# Patient Record
Sex: Male | Born: 1965 | Race: White | Hispanic: No | Marital: Married | Smoking: Former smoker
Health system: Southern US, Community
[De-identification: ages and names within clinical notes are randomized; demographics above are authoritative.]

## PROBLEM LIST (undated history)

## (undated) DIAGNOSIS — K219 Gastro-esophageal reflux disease without esophagitis: Secondary | ICD-10-CM

## (undated) DIAGNOSIS — Z9889 Other specified postprocedural states: Secondary | ICD-10-CM

## (undated) DIAGNOSIS — M1990 Unspecified osteoarthritis, unspecified site: Secondary | ICD-10-CM

## (undated) DIAGNOSIS — R112 Nausea with vomiting, unspecified: Secondary | ICD-10-CM

## (undated) HISTORY — PX: REFRACTIVE SURGERY: SHX103

## (undated) HISTORY — PX: OTHER SURGICAL HISTORY: SHX169

## (undated) HISTORY — PX: JOINT REPLACEMENT: SHX530

## (undated) HISTORY — PX: CARPAL TUNNEL RELEASE: SHX101

## (undated) HISTORY — PX: VASECTOMY: SHX75

---

## 2003-11-20 ENCOUNTER — Emergency Department (HOSPITAL_COMMUNITY): Admission: EM | Admit: 2003-11-20 | Discharge: 2003-11-20 | Payer: Self-pay | Admitting: Emergency Medicine

## 2008-08-31 ENCOUNTER — Encounter: Admission: RE | Admit: 2008-08-31 | Discharge: 2008-08-31 | Payer: Self-pay | Admitting: Occupational Medicine

## 2010-08-26 ENCOUNTER — Encounter: Payer: Self-pay | Admitting: Family Medicine

## 2010-08-26 ENCOUNTER — Inpatient Hospital Stay (INDEPENDENT_AMBULATORY_CARE_PROVIDER_SITE_OTHER)
Admission: RE | Admit: 2010-08-26 | Discharge: 2010-08-26 | Disposition: A | Discharge status: Home / Self Care | Payer: 59 | Source: Ambulatory Visit | Attending: Family Medicine | Admitting: Family Medicine

## 2010-08-26 DIAGNOSIS — M940 Chondrocostal junction syndrome [Tietze]: Secondary | ICD-10-CM

## 2010-08-26 DIAGNOSIS — B084 Enteroviral vesicular stomatitis with exanthem: Secondary | ICD-10-CM

## 2010-08-28 ENCOUNTER — Telehealth (INDEPENDENT_AMBULATORY_CARE_PROVIDER_SITE_OTHER): Payer: Self-pay | Admitting: Emergency Medicine

## 2011-04-17 NOTE — Telephone Encounter (Signed)
  Phone Note Outgoing Call Call back at [facility] Phone [phone]   Call placed by: Emilio Math,  August 28, 2010 3:04 PM Call placed to: Patient Summary of Call: Left msg hope he is better give [place] a call if not getting better

## 2011-04-17 NOTE — Letter (Signed)
Summary: Out of Work  [facility]  [address]   [place], [place] [address]   Phone: [phone]  Fax: [phone]    August 26, 2010   Employee:  Sergio Harper    To Whom It May Concern:   [name] was evaluated in our clinic this morning and may safely return to work.   If you need additional information, please feel free to contact our office.         Sincerely,    Donna Christen MD

## 2011-04-17 NOTE — Progress Notes (Signed)
Summary: BLISTERS ON HANDS AND FEET Room 5   Vital Signs:  Patient Profile:   45 Years Old Male CC:      Painful red rash on feet and hands x 3 days Height:     74 inches Weight:      235 pounds O2 Sat:      99 % O2 treatment:    Room Air Temp:     98.7 degrees F oral Pulse rate:   76 / minute Pulse rhythm:   regular Resp:     12 per minute BP sitting:   108 / 73  (left arm) Cuff size:   regular  Vitals Entered By: Emilio Math (August 26, 2010 10:14 AM)                  Current Allergies: No known allergies History of Present Illness Chief Complaint: Painful red rash on feet and hands x 3 days History of Present Illness:  Subjective:  Patient complains of awakening four days ago with fatigue, myalgias, chills, and anterior chest discomfort.  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  He remained fatigued through the next day with low grade fever that evenually resolved.  Two days ago he developed an uncomfortable rash on the fingers of both hands, and a similar rash on both feet.  No lesions in mouth.  No sore throat or respiratory symptoms.  He now feels better except for rash on hands and feet.  Current Meds L-LYSINE 1000 MG TABS (LYSINE)  TYLENOL ARTHRITIS PAIN 650 MG CR-TABS (ACETAMINOPHEN)  VITAMIN D 400 UNIT CAPS (CHOLECALCIFEROL)  FISH OIL 1000 MG CAPS (OMEGA-3 FATTY ACIDS)  CEPHALEXIN 500 MG TABS (CEPHALEXIN) One by mouth three times daily (every 8 hours) (Rx void after 09/02/10).  REVIEW OF SYSTEMS Constitutional Symptoms      Denies fever, chills, night sweats, weight loss, weight gain, and fatigue.  Eyes       Denies change in vision, eye pain, eye discharge, glasses, contact lenses, and eye surgery. Ear/Nose/Throat/Mouth       Denies hearing loss/aids, change in hearing, ear pain, ear discharge, dizziness, frequent runny nose, frequent nose bleeds, sinus problems, sore throat, hoarseness, and tooth pain or bleeding.  Respiratory  Denies dry cough, productive cough, wheezing, shortness of breath, asthma, bronchitis, and emphysema/COPD.  Cardiovascular       Denies murmurs, chest pain, and tires easily with exhertion.    Gastrointestinal       Denies stomach pain, nausea/vomiting, diarrhea, constipation, blood in bowel movements, and indigestion. Genitourniary       Denies painful urination, kidney stones, and loss of urinary control. Neurological       Denies paralysis, seizures, and fainting/blackouts. Musculoskeletal       Denies muscle pain, joint pain, joint stiffness, decreased range of motion, redness, swelling, muscle weakness, and gout.  Skin       Denies bruising, unusual mles/lumps or sores, and hair/skin or nail changes.  Psych       Denies mood changes, temper/anger issues, anxiety/stress, speech problems, depression, and sleep problems.  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  Both feet  have similar subcutaneous vesicles on dorsa and plantar surfaces, mildly tender to palpation.  No swelling. Eyes:  Pupils are equal, round, and reactive to light and accomdation.  Extraocular movement is intact.  Conjunctivae are not inflamed.  Mouth/pharynx:  No lesions. Neck:  Supple.  No adenopathy is present.  Lungs:  Clear to auscultation.  Breath sounds are equal.  Chest:  Distinct tenderness over sternum Heart:  Regular rate and rhythm without murmurs, rubs, or gallops.  Abdomen:  Nontender without masses or hepatosplenomegaly.  Bowel sounds are present.  No CVA or flank tenderness.  Extremities:  No edema.   CBC:  WBC 9.1 with normal diff; Hgb  14.5 Assessment New Problems: COSTOCHONDRITIS, ACUTE (ICD-733.6) HAND, FOOT, AND MOUTH DISEASE (ICD-074.3)   Plan New Medications/Changes: CEPHALEXIN 500 MG TABS (CEPHALEXIN) One by mouth three times daily (every 8 hours) (Rx void after 09/02/10).  #30 x 0, 08/26/2010, Donna Christen MD  New Orders: New Patient Level III [id number] CBC w/Diff [[id number]] Planning Comments:   Reassurance.  Treat symptomatically for now with Tylenol. If lesions on feet become increasingly painful, swollen, draining, etc. after 3 to 4 days, add Keflex to cover a cellulitis (given an Rx to hold) Given a [facility] patient information and instruction sheet on topics  Follow-up with PCP if not improving one week.   The patient and/or caregiver has been counseled thoroughly with regard to medications prescribed including dosage, schedule, interactions, rationale for use, and possible side effects and they verbalize understanding.  Diagnoses and expected course of recovery discussed and will return if not improved as expected or if the condition worsens. Patient and/or caregiver verbalized understanding.  Prescriptions: CEPHALEXIN 500 MG TABS (CEPHALEXIN) One by mouth three times daily (every 8 hours) (Rx void after 09/02/10).  #30 x 0   Entered and Authorized by:   Donna Christen MD   Signed by:   Donna Christen MD on 08/26/2010   Method used:   Print then Give to Patient   RxID:   [id number]   Orders Added: 1)  New Patient Level III [78469] 2)  CBC w/Diff [[id number]]

## 2012-02-21 ENCOUNTER — Other Ambulatory Visit: Payer: Self-pay | Admitting: Family Medicine

## 2012-02-21 ENCOUNTER — Ambulatory Visit: Payer: Self-pay

## 2012-02-21 DIAGNOSIS — S8991XA Unspecified injury of right lower leg, initial encounter: Secondary | ICD-10-CM

## 2013-07-08 IMAGING — CR DG KNEE COMPLETE 4+V*R*
4 series · 4 of 4 positions shown · non-contrast
Comparison: 08/31/2008

CLINICAL DATA: Injury with pain

RIGHT KNEE - COMPLETE 4+ VIEW

[view not recorded (1 of 4)]
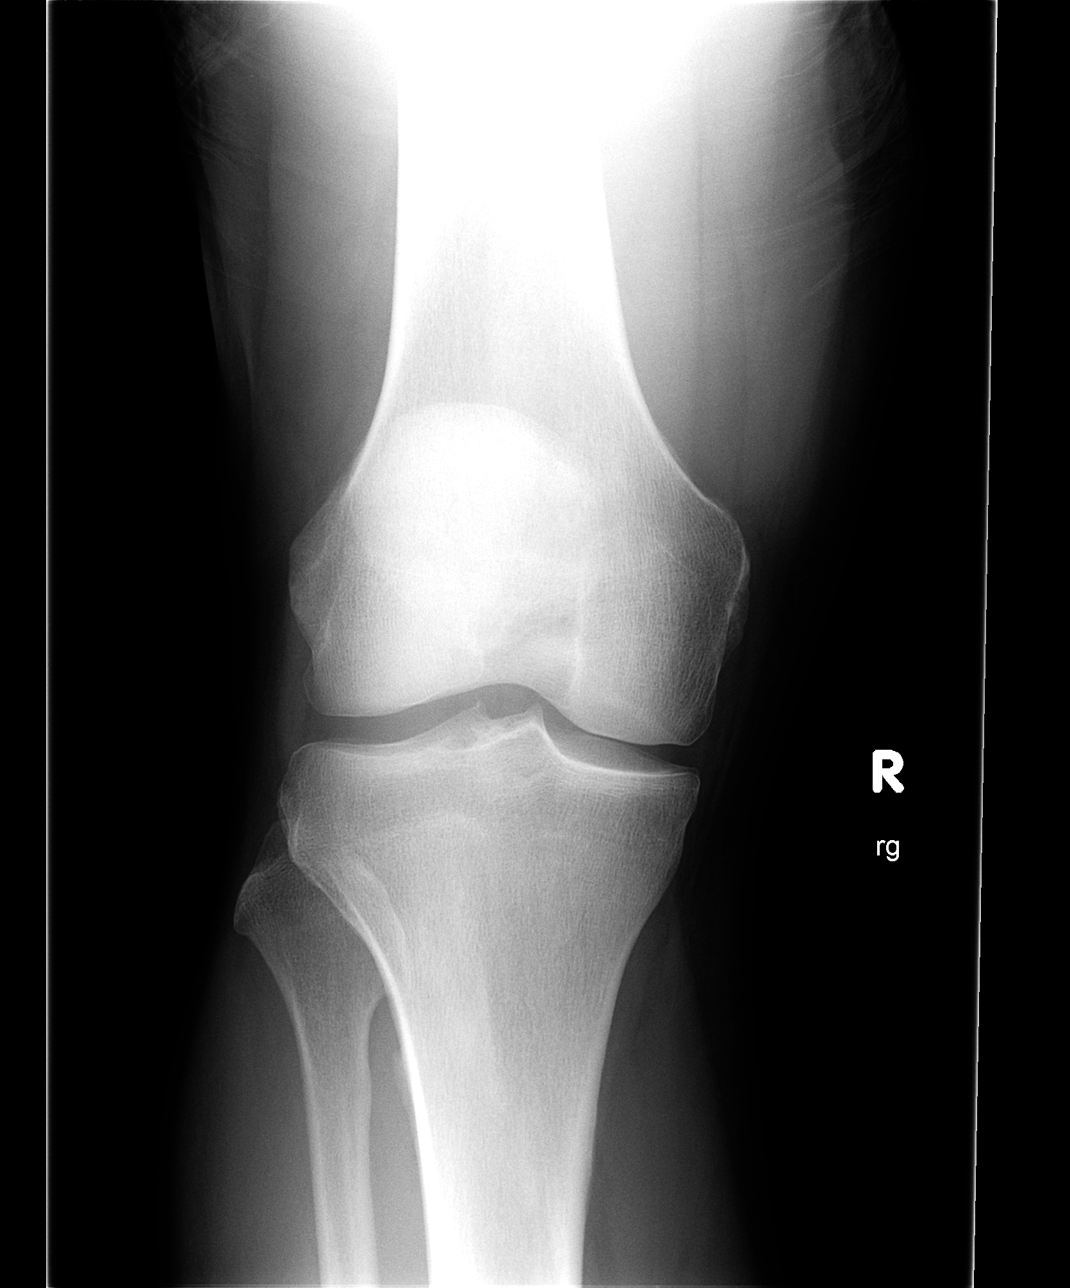

[view not recorded (2 of 4)]
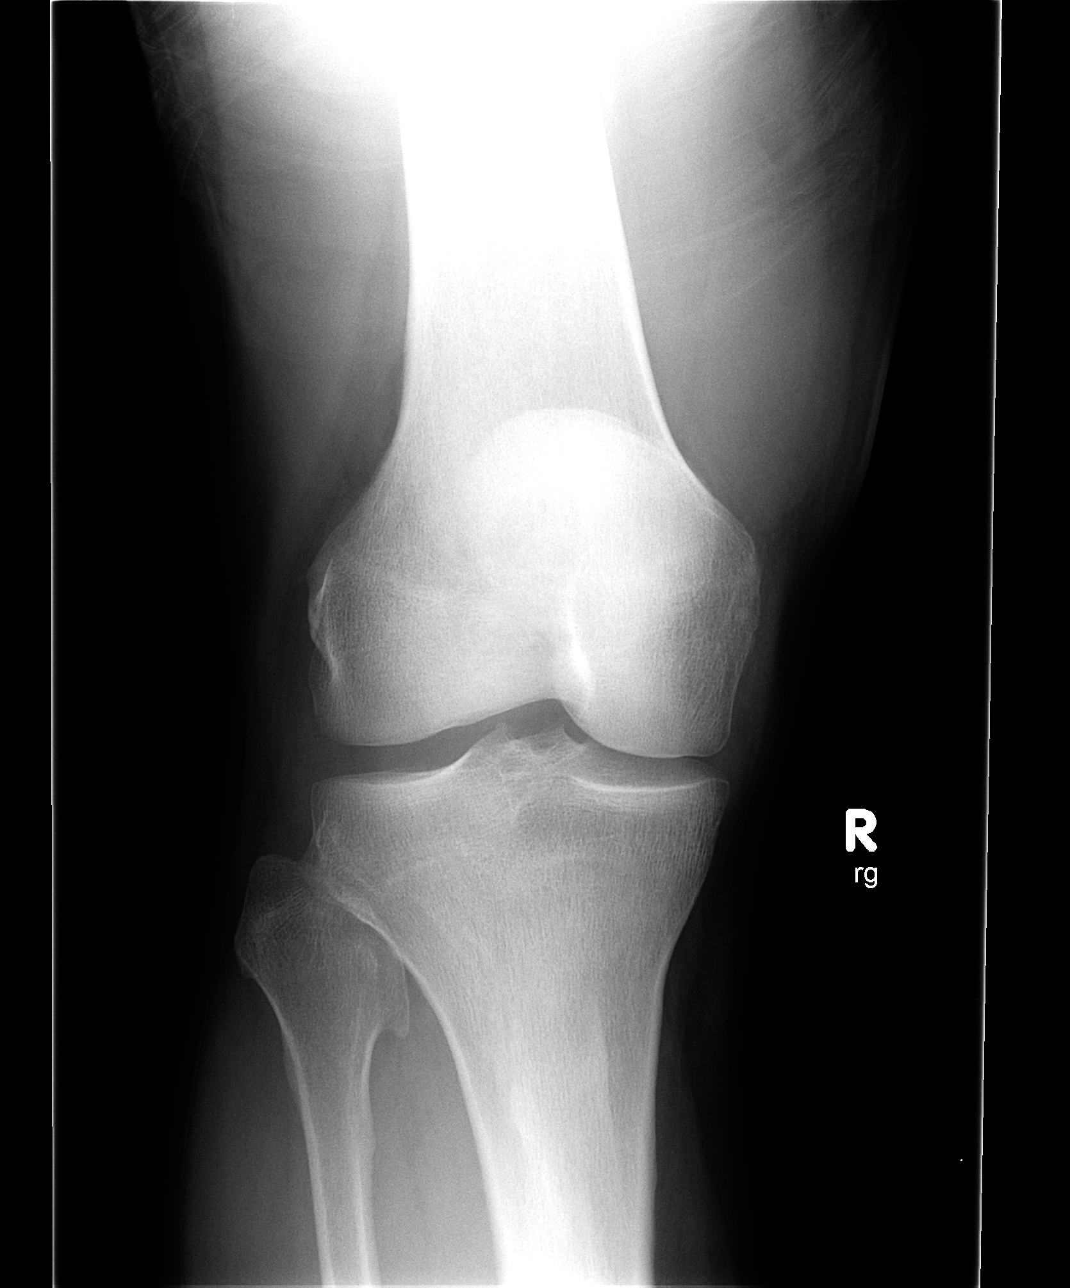

[view not recorded (3 of 4)]
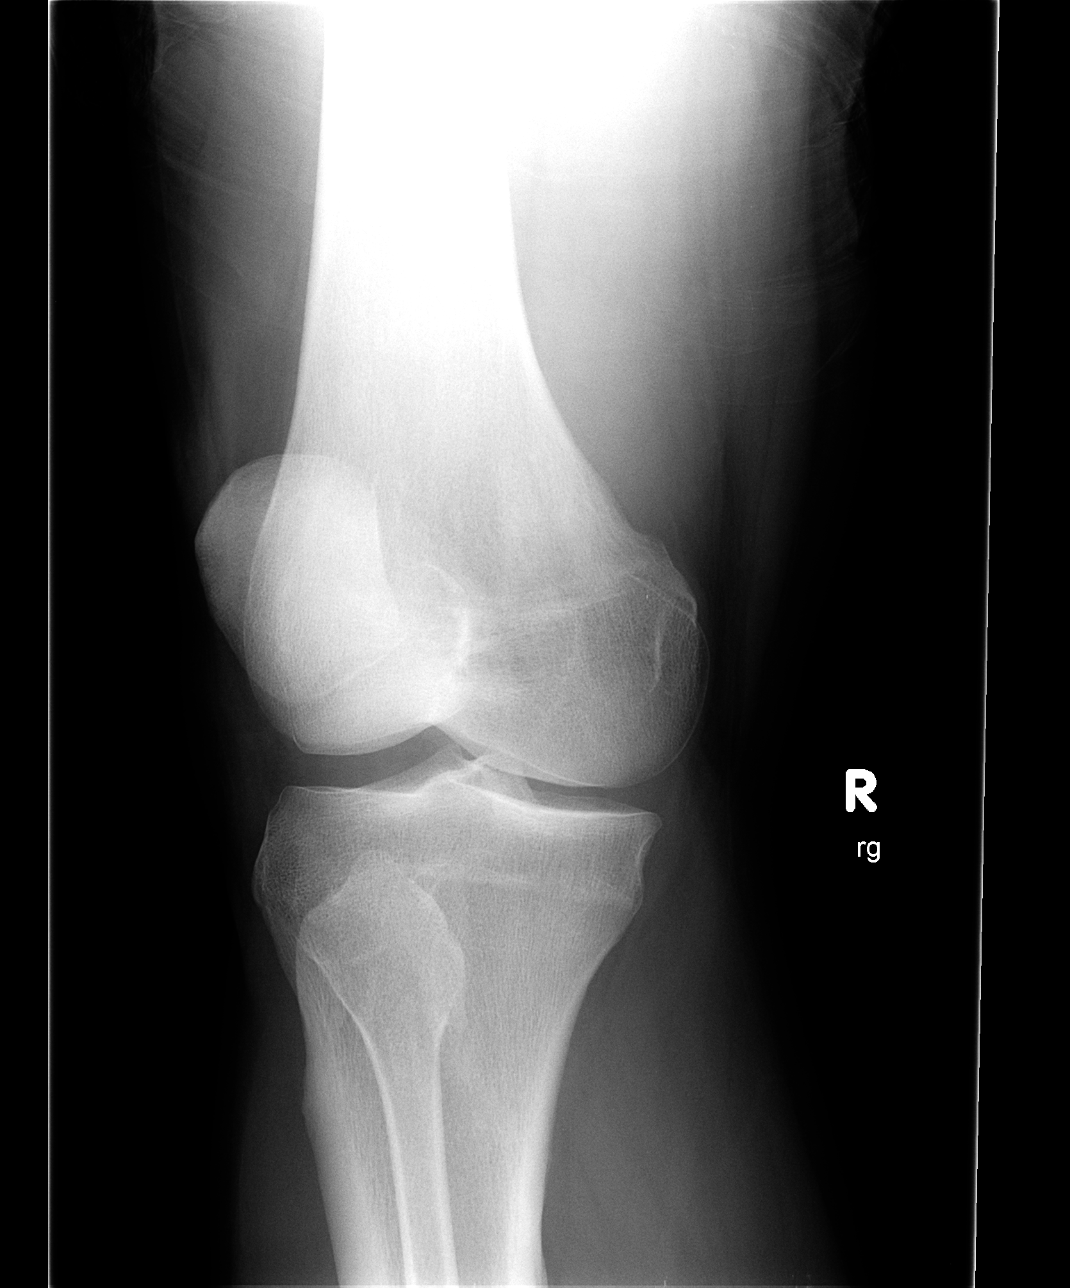

[view not recorded (4 of 4)]
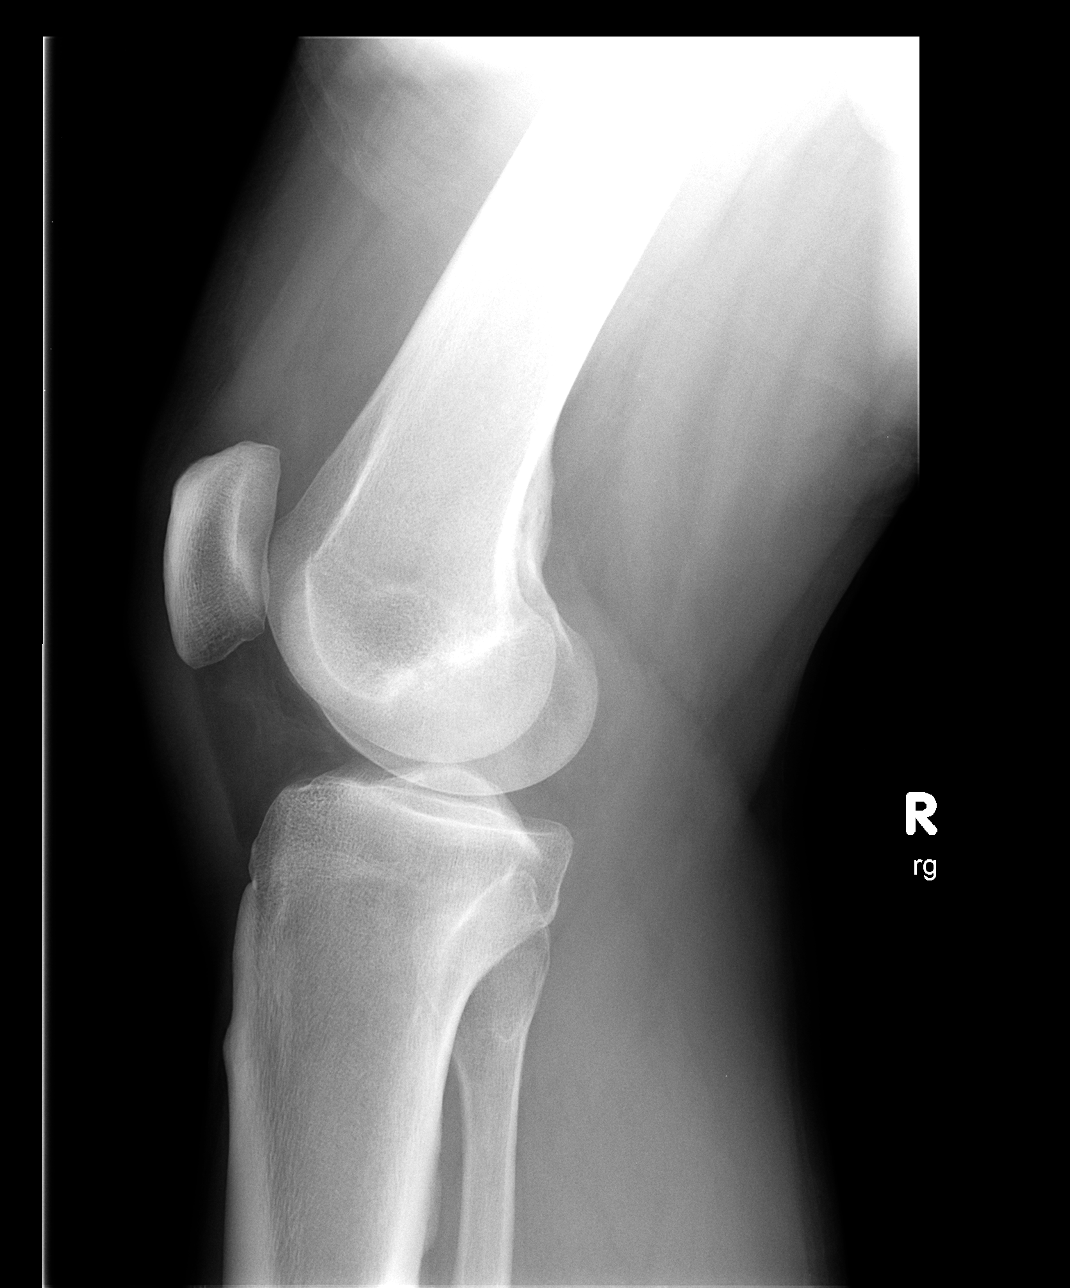

[4 of 4 positions shown; findings below may reference images not displayed]

FINDINGS: No evidence of fracture, dislocation, degenerative change
or joint effusion.
IMPRESSION: Negative radiographs

## 2016-12-11 ENCOUNTER — Ambulatory Visit: Payer: Self-pay | Admitting: Orthopedic Surgery

## 2016-12-22 NOTE — Patient Instructions (Signed)
Sergio Harper  12/22/2016   Your procedure is scheduled on: 01/03/2017    [facility]   Report to admitting at   0815 AM   Call this number if you have problems the morning of surgery  [phone]   Remember: ONLY 1 PERSON MAY GO WITH YOU TO SHORT STAY TO GET  READY MORNING OF YOUR SURGERY.  Do not eat food or drink liquids :After Midnight.     Take these medicines the morning of surgery with A SIP OF WATER:  DO NOT TAKE ANY DIABETIC MEDICATIONS DAY OF YOUR SURGERY                               You may not have any metal on your body including hair pins and              piercings  Do not wear jewelry, , lotions, powders or perfumes, deodorant                        Men may shave face and neck.   Do not bring valuables to the hospital. [facility] IS NOT             RESPONSIBLE   FOR VALUABLES.  Contacts, dentures or bridgework may not be worn into surgery.  Leave suitcase in the car. After surgery it may be brought to your room.                       Please read over the following fact sheets you were given: _____________________________________________________________________             [facility] - Preparing for Surgery Before surgery, you can play an important role.  Because skin is not sterile, your skin needs to be as free of germs as possible.  You can reduce the number of germs on your skin by washing with CHG (chlorahexidine gluconate) soap before surgery.  CHG is an antiseptic cleaner which kills germs and bonds with the skin to continue killing germs even after washing. Please DO NOT use if you have an allergy to CHG or antibacterial soaps.  If your skin becomes reddened/irritated stop using the CHG and inform your nurse when you arrive at Short Stay. Do not shave (including legs and underarms) for at least 48 hours prior to the first CHG shower.  You may shave your face/neck. Please follow these instructions  carefully:  1.  Shower with CHG Soap the night before surgery and the  morning of Surgery.  2.  If you choose to wash your hair, wash your hair first as usual with your  normal  shampoo.  3.  After you shampoo, rinse your hair and body thoroughly to remove the  shampoo.                           4.  Use CHG as you would any other liquid soap.  You can apply chg directly  to the skin and wash                       Gently with a scrungie or clean washcloth.  5.  Apply the CHG Soap to your body ONLY  FROM THE NECK DOWN.   Do not use on face/ open                           Wound or open sores. Avoid contact with eyes, ears mouth and genitals (private parts).                       Wash face,  Genitals (private parts) with your normal soap.             6.  Wash thoroughly, paying special attention to the area where your surgery  will be performed.  7.  Thoroughly rinse your body with warm water from the neck down.  8.  DO NOT shower/wash with your normal soap after using and rinsing off  the CHG Soap.                9.  Pat yourself dry with a clean towel.            10.  Wear clean pajamas.            11.  Place clean sheets on your bed the night of your first shower and do not  sleep with pets. Day of Surgery : Do not apply any lotions/deodorants the morning of surgery.  Please wear clean clothes to the hospital/surgery center.  FAILURE TO FOLLOW THESE INSTRUCTIONS MAY RESULT IN THE CANCELLATION OF YOUR SURGERY PATIENT SIGNATURE_________________________________  NURSE SIGNATURE__________________________________  ________________________________________________________________________  WHAT IS A BLOOD TRANSFUSION? Blood Transfusion Information  A transfusion is the replacement of blood or some of its parts. Blood is made up of multiple cells which provide different functions.  Red blood cells carry oxygen and are used for blood loss replacement.  White blood cells fight against  infection.  Platelets control bleeding.  Plasma helps clot blood.  Other blood products are available for specialized needs, such as hemophilia or other clotting disorders. BEFORE THE TRANSFUSION  Who gives blood for transfusions?   Healthy volunteers who are fully evaluated to make sure their blood is safe. This is blood bank blood. Transfusion therapy is the safest it has ever been in the practice of medicine. Before blood is taken from a donor, a complete history is taken to make sure that person has no history of diseases nor engages in risky social behavior (examples are intravenous drug use or sexual activity with multiple partners). The donor's travel history is screened to minimize risk of transmitting infections, such as malaria. The donated blood is tested for signs of infectious diseases, such as HIV and hepatitis. The blood is then tested to be sure it is compatible with you in order to minimize the chance of a transfusion reaction. If you or a relative donates blood, this is often done in anticipation of surgery and is not appropriate for emergency situations. It takes many days to process the donated blood. RISKS AND COMPLICATIONS Although transfusion therapy is very safe and saves many lives, the main dangers of transfusion include:   Getting an infectious disease.  Developing a transfusion reaction. This is an allergic reaction to something in the blood you were given. Every precaution is taken to prevent this. The decision to have a blood transfusion has been considered carefully by your caregiver before blood is given. Blood is not given unless the benefits outweigh the risks. AFTER THE TRANSFUSION  Right after receiving a blood transfusion, you will usually feel much better  and more energetic. This is especially true if your red blood cells have gotten low (anemic). The transfusion raises the level of the red blood cells which carry oxygen, and this usually causes an energy  increase.  The nurse administering the transfusion will monitor you carefully for complications. HOME CARE INSTRUCTIONS  No special instructions are needed after a transfusion. You may find your energy is better. Speak with your caregiver about any limitations on activity for underlying diseases you may have. SEEK MEDICAL CARE IF:   Your condition is not improving after your transfusion.  You develop redness or irritation at the intravenous (IV) site. SEEK IMMEDIATE MEDICAL CARE IF:  Any of the following symptoms occur over the next 12 hours:  Shaking chills.  You have a temperature by mouth above 102 F (38.9 C), not controlled by medicine.  Chest, back, or muscle pain.  People around you feel you are not acting correctly or are confused.  Shortness of breath or difficulty breathing.  Dizziness and fainting.  You get a rash or develop hives.  You have a decrease in urine output.  Your urine turns a dark color or changes to pink, red, or brown. Any of the following symptoms occur over the next 10 days:  You have a temperature by mouth above 102 F (38.9 C), not controlled by medicine.  Shortness of breath.  Weakness after normal activity.  The white part of the eye turns yellow (jaundice).  You have a decrease in the amount of urine or are urinating less often.  Your urine turns a dark color or changes to pink, red, or brown. Document Released: 04/28/2000 Document Revised: 07/24/2011 Document Reviewed: 12/16/2007 Exit[employer] Patient Information 2014 [place].  _______________________________________________________________________  Incentive Spirometer  An incentive spirometer is a tool that can help keep your lungs clear and active. This tool measures how well you are filling your lungs with each breath. Taking long deep breaths may help reverse or decrease the chance of developing breathing (pulmonary) problems (especially infection) following:  A long  period of time when you are unable to move or be active. BEFORE THE PROCEDURE   If the spirometer includes an indicator to show your best effort, your nurse or respiratory therapist will set it to a desired goal.  If possible, sit up straight or lean slightly forward. Try not to slouch.  Hold the incentive spirometer in an upright position. INSTRUCTIONS FOR USE  1. Sit on the edge of your bed if possible, or sit up as far as you can in bed or on a chair. 2. Hold the incentive spirometer in an upright position. 3. Breathe out normally. 4. Place the mouthpiece in your mouth and seal your lips tightly around it. 5. Breathe in slowly and as deeply as possible, raising the piston or the ball toward the top of the column. 6. Hold your breath for 3-5 seconds or for as long as possible. Allow the piston or ball to fall to the bottom of the column. 7. Remove the mouthpiece from your mouth and breathe out normally. 8. Rest for a few seconds and repeat Steps 1 through 7 at least 10 times every 1-2 hours when you are awake. Take your time and take a few normal breaths between deep breaths. 9. The spirometer may include an indicator to show your best effort. Use the indicator as a goal to work toward during each repetition. 10. After each set of 10 deep breaths, practice coughing to be sure your  lungs are clear. If you have an incision (the cut made at the time of surgery), support your incision when coughing by placing a pillow or rolled up towels firmly against it. Once you are able to get out of bed, walk around indoors and cough well. You may stop using the incentive spirometer when instructed by your caregiver.  RISKS AND COMPLICATIONS  Take your time so you do not get dizzy or light-headed.  If you are in pain, you may need to take or ask for pain medication before doing incentive spirometry. It is harder to take a deep breath if you are having pain. AFTER USE  Rest and breathe slowly and  easily.  It can be helpful to keep track of a log of your progress. Your caregiver can provide you with a simple table to help with this. If you are using the spirometer at home, follow these instructions: [place] IF:   You are having difficultly using the spirometer.  You have trouble using the spirometer as often as instructed.  Your pain medication is not giving enough relief while using the spirometer.  You develop fever of 100.5 F (38.1 C) or higher. SEEK IMMEDIATE MEDICAL CARE IF:   You cough up bloody sputum that had not been present before.  You develop fever of 102 F (38.9 C) or greater.  You develop worsening pain at or near the incision site. MAKE SURE YOU:   Understand these instructions.  Will watch your condition.  Will get help right away if you are not doing well or get worse. Document Released: 09/11/2006 Document Revised: 07/24/2011 Document Reviewed: 11/12/2006 [facility] Patient Information 2014 [place], [place].   ________________________________________________________________________

## 2016-12-26 ENCOUNTER — Encounter (HOSPITAL_COMMUNITY)
Admission: RE | Admit: 2016-12-26 | Discharge: 2016-12-26 | Disposition: A | Discharge status: Home / Self Care | Payer: BLUE CROSS/BLUE SHIELD | Source: Ambulatory Visit | Attending: Orthopedic Surgery | Admitting: Orthopedic Surgery

## 2016-12-26 ENCOUNTER — Encounter (HOSPITAL_COMMUNITY): Payer: Self-pay

## 2016-12-26 ENCOUNTER — Encounter (INDEPENDENT_AMBULATORY_CARE_PROVIDER_SITE_OTHER): Payer: Self-pay

## 2016-12-26 DIAGNOSIS — M1612 Unilateral primary osteoarthritis, left hip: Secondary | ICD-10-CM | POA: Insufficient documentation

## 2016-12-26 DIAGNOSIS — Z01812 Encounter for preprocedural laboratory examination: Secondary | ICD-10-CM | POA: Insufficient documentation

## 2016-12-26 HISTORY — DX: Gastro-esophageal reflux disease without esophagitis: K21.9

## 2016-12-26 HISTORY — DX: Unspecified osteoarthritis, unspecified site: M19.90

## 2016-12-26 HISTORY — DX: Other specified postprocedural states: Z98.890

## 2016-12-26 HISTORY — DX: Other specified postprocedural states: R11.2

## 2016-12-26 LAB — COMPREHENSIVE METABOLIC PANEL
ALBUMIN: 4.3 g/dL (ref 3.5–5.0)
ALT: 29 U/L (ref 17–63)
ANION GAP: 6 (ref 5–15)
AST: 30 U/L (ref 15–41)
Alkaline Phosphatase: 55 U/L (ref 38–126)
BILIRUBIN TOTAL: 0.8 mg/dL (ref 0.3–1.2)
BUN: 18 mg/dL (ref 6–20)
CHLORIDE: 106 mmol/L (ref 101–111)
CO2: 28 mmol/L (ref 22–32)
Calcium: 9.4 mg/dL (ref 8.9–10.3)
Creatinine, Ser: 1.14 mg/dL (ref 0.61–1.24)
GFR calc Af Amer: >60 mL/min (ref >60)
GFR calc non Af Amer: >60 mL/min (ref >60)
GLUCOSE: 83 mg/dL (ref 65–99)
POTASSIUM: 4.7 mmol/L (ref 3.5–5.1)
SODIUM: 140 mmol/L (ref 135–145)
Total Protein: 6.6 g/dL (ref 6.5–8.1)

## 2016-12-26 LAB — CBC
HEMATOCRIT: 42.2 % (ref 39.0–52.0)
Hemoglobin: 14.9 g/dL (ref 13.0–17.0)
MCH: 30.8 pg (ref 26.0–34.0)
MCHC: 35.3 g/dL (ref 30.0–36.0)
MCV: 87.4 fL (ref 78.0–100.0)
PLATELETS: 164 10*3/uL (ref 150–400)
RBC: 4.83 MIL/uL (ref 4.22–5.81)
RDW: 12.5 % (ref 11.5–15.5)
WBC: 6.3 10*3/uL (ref 4.0–10.5)

## 2016-12-26 LAB — PROTIME-INR
INR: 1.09
Prothrombin Time: 14.1 seconds (ref 11.4–15.2)

## 2016-12-26 LAB — APTT: APTT: 31 s (ref 24–36)

## 2016-12-26 LAB — SURGICAL PCR SCREEN
MRSA, PCR: NEGATIVE
STAPHYLOCOCCUS AUREUS: POSITIVE — AB

## 2016-12-27 LAB — ABO/RH: ABO/RH(D): B POS

## 2017-01-02 ENCOUNTER — Ambulatory Visit: Payer: Self-pay | Admitting: Orthopedic Surgery
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
## 2017-01-03 ENCOUNTER — Inpatient Hospital Stay (HOSPITAL_COMMUNITY)
Admission: RE | Admit: 2017-01-03 | Discharge: 2017-01-04 | DRG: 470 | Disposition: A | Discharge status: Home / Self Care | Payer: BLUE CROSS/BLUE SHIELD | Source: Ambulatory Visit | Attending: Orthopedic Surgery | Admitting: Orthopedic Surgery

## 2017-01-03 ENCOUNTER — Encounter (HOSPITAL_COMMUNITY): Payer: Self-pay | Admitting: *Deleted

## 2017-01-03 ENCOUNTER — Inpatient Hospital Stay (HOSPITAL_COMMUNITY): Payer: BLUE CROSS/BLUE SHIELD | Admitting: Certified Registered Nurse Anesthetist

## 2017-01-03 ENCOUNTER — Inpatient Hospital Stay (HOSPITAL_COMMUNITY): Payer: BLUE CROSS/BLUE SHIELD

## 2017-01-03 ENCOUNTER — Encounter (HOSPITAL_COMMUNITY)
Admission: RE | Disposition: A | Discharge status: Home / Self Care | Payer: Self-pay | Source: Ambulatory Visit | Attending: Orthopedic Surgery

## 2017-01-03 DIAGNOSIS — M169 Osteoarthritis of hip, unspecified: Secondary | ICD-10-CM | POA: Diagnosis present

## 2017-01-03 DIAGNOSIS — Z419 Encounter for procedure for purposes other than remedying health state, unspecified: Secondary | ICD-10-CM

## 2017-01-03 DIAGNOSIS — Z96641 Presence of right artificial hip joint: Secondary | ICD-10-CM | POA: Diagnosis present

## 2017-01-03 DIAGNOSIS — Z87891 Personal history of nicotine dependence: Secondary | ICD-10-CM

## 2017-01-03 DIAGNOSIS — M1612 Unilateral primary osteoarthritis, left hip: Secondary | ICD-10-CM | POA: Diagnosis present

## 2017-01-03 DIAGNOSIS — Z96649 Presence of unspecified artificial hip joint: Secondary | ICD-10-CM

## 2017-01-03 DIAGNOSIS — M25552 Pain in left hip: Secondary | ICD-10-CM | POA: Diagnosis present

## 2017-01-03 HISTORY — PX: TOTAL HIP ARTHROPLASTY: SHX124

## 2017-01-03 LAB — TYPE AND SCREEN
ABO/RH(D): B POS
ANTIBODY SCREEN: NEGATIVE

## 2017-01-03 SURGERY — ARTHROPLASTY, HIP, TOTAL, ANTERIOR APPROACH
Anesthesia: Spinal | Site: Hip | Laterality: Left

## 2017-01-03 MED ORDER — MORPHINE SULFATE (PF) 2 MG/ML IV SOLN
2.0000 mg | INTRAVENOUS | Status: AC | PRN
  Administered 2017-01-03 (×2): 2 mg via INTRAVENOUS
  Filled 2017-01-03 (×3): qty 1

## 2017-01-03 MED ORDER — METHOCARBAMOL 1000 MG/10ML IJ SOLN
500.0000 mg | Freq: Four times a day (QID) | INTRAVENOUS | Status: DC | PRN
  Administered 2017-01-03: 500 mg via INTRAVENOUS
  Filled 2017-01-03: qty 550

## 2017-01-03 MED ORDER — PROPOFOL 10 MG/ML IV BOLUS
INTRAVENOUS | Status: DC | PRN
  Administered 2017-01-03: 20 mg via INTRAVENOUS
  Administered 2017-01-03 (×4): 10 mg via INTRAVENOUS

## 2017-01-03 MED ORDER — DIPHENHYDRAMINE HCL 50 MG/ML IJ SOLN
INTRAMUSCULAR | Status: AC
  Filled 2017-01-03: qty 1

## 2017-01-03 MED ORDER — OXYCODONE HCL 5 MG PO TABS
5.0000 mg | ORAL_TABLET | ORAL | Status: DC | PRN
  Administered 2017-01-03 (×3): 10 mg via ORAL
  Filled 2017-01-03 (×3): qty 2

## 2017-01-03 MED ORDER — SODIUM CHLORIDE 0.9 % IV SOLN
INTRAVENOUS | Status: DC
  Administered 2017-01-03 (×2): via INTRAVENOUS

## 2017-01-03 MED ORDER — TRANEXAMIC ACID 1000 MG/10ML IV SOLN
1000.0000 mg | Freq: Once | INTRAVENOUS | Status: AC
  Administered 2017-01-03: 15:00:00 1000 mg via INTRAVENOUS
  Filled 2017-01-03: qty 1100

## 2017-01-03 MED ORDER — MORPHINE SULFATE (PF) 2 MG/ML IV SOLN
1.0000 mg | INTRAVENOUS | Status: DC | PRN
  Administered 2017-01-03: 15:00:00 1 mg via INTRAVENOUS
  Administered 2017-01-03: 20:00:00 2 mg via INTRAVENOUS
  Filled 2017-01-03: qty 1

## 2017-01-03 MED ORDER — POLYETHYLENE GLYCOL 3350 17 G PO PACK: 17.0000 g | PACK | Freq: Every day | ORAL | Status: DC | PRN

## 2017-01-03 MED ORDER — CEFAZOLIN SODIUM-DEXTROSE 2-4 GM/100ML-% IV SOLN
INTRAVENOUS | Status: AC
  Filled 2017-01-03: qty 100

## 2017-01-03 MED ORDER — ACETAMINOPHEN 500 MG PO TABS
1000.0000 mg | ORAL_TABLET | Freq: Four times a day (QID) | ORAL | Status: DC
  Administered 2017-01-03: 1000 mg via ORAL
  Filled 2017-01-03: qty 2

## 2017-01-03 MED ORDER — BUPIVACAINE HCL (PF) 0.25 % IJ SOLN
INTRAMUSCULAR | Status: DC | PRN
  Administered 2017-01-03: 30 mL

## 2017-01-03 MED ORDER — TRAMADOL HCL 50 MG PO TABS
50.0000 mg | ORAL_TABLET | Freq: Four times a day (QID) | ORAL | Status: DC | PRN
  Filled 2017-01-03: qty 2

## 2017-01-03 MED ORDER — CEFAZOLIN SODIUM-DEXTROSE 2-4 GM/100ML-% IV SOLN
2.0000 g | Freq: Four times a day (QID) | INTRAVENOUS | Status: AC
  Administered 2017-01-03 (×2): 2 g via INTRAVENOUS
  Filled 2017-01-03 (×2): qty 100

## 2017-01-03 MED ORDER — FLEET ENEMA 7-19 GM/118ML RE ENEM: 1.0000 | ENEMA | Freq: Once | RECTAL | Status: DC | PRN

## 2017-01-03 MED ORDER — FENTANYL CITRATE (PF) 100 MCG/2ML IJ SOLN
25.0000 ug | INTRAMUSCULAR | Status: DC | PRN
  Administered 2017-01-03: 50 ug via INTRAVENOUS

## 2017-01-03 MED ORDER — METOCLOPRAMIDE HCL 5 MG/ML IJ SOLN
5.0000 mg | Freq: Three times a day (TID) | INTRAMUSCULAR | Status: DC | PRN
  Administered 2017-01-04: 10 mg via INTRAVENOUS
  Filled 2017-01-03: qty 2

## 2017-01-03 MED ORDER — DIPHENHYDRAMINE HCL 50 MG/ML IJ SOLN
INTRAMUSCULAR | Status: DC | PRN
  Administered 2017-01-03: 25 mg via INTRAVENOUS

## 2017-01-03 MED ORDER — RIVAROXABAN 10 MG PO TABS
10.0000 mg | ORAL_TABLET | Freq: Every day | ORAL | Status: DC
  Administered 2017-01-04: 10 mg via ORAL
  Filled 2017-01-03: qty 1

## 2017-01-03 MED ORDER — DEXAMETHASONE SODIUM PHOSPHATE 10 MG/ML IJ SOLN
10.0000 mg | Freq: Once | INTRAMUSCULAR | Status: AC
  Administered 2017-01-04: 10 mg via INTRAVENOUS
  Filled 2017-01-03: qty 1

## 2017-01-03 MED ORDER — ONDANSETRON HCL 4 MG/2ML IJ SOLN
INTRAMUSCULAR | Status: DC | PRN
  Administered 2017-01-03: 4 mg via INTRAVENOUS

## 2017-01-03 MED ORDER — BUPIVACAINE HCL (PF) 0.5 % IJ SOLN
INTRAMUSCULAR | Status: DC | PRN
  Administered 2017-01-03: 3 mL

## 2017-01-03 MED ORDER — CHLORHEXIDINE GLUCONATE 4 % EX LIQD: 60.0000 mL | Freq: Once | CUTANEOUS | Status: DC

## 2017-01-03 MED ORDER — ACETAMINOPHEN 325 MG PO TABS: 650.0000 mg | ORAL_TABLET | Freq: Four times a day (QID) | ORAL | Status: DC | PRN

## 2017-01-03 MED ORDER — 0.9 % SODIUM CHLORIDE (POUR BTL) OPTIME
TOPICAL | Status: DC | PRN
  Administered 2017-01-03: 250 mL

## 2017-01-03 MED ORDER — BISACODYL 10 MG RE SUPP: 10.0000 mg | Freq: Every day | RECTAL | Status: DC | PRN

## 2017-01-03 MED ORDER — ONDANSETRON HCL 4 MG/2ML IJ SOLN
INTRAMUSCULAR | Status: AC
  Filled 2017-01-03: qty 2

## 2017-01-03 MED ORDER — PROPOFOL 10 MG/ML IV BOLUS
INTRAVENOUS | Status: AC
  Filled 2017-01-03: qty 20

## 2017-01-03 MED ORDER — BUPIVACAINE HCL (PF) 0.25 % IJ SOLN
INTRAMUSCULAR | Status: AC
  Filled 2017-01-03: qty 30

## 2017-01-03 MED ORDER — PROPOFOL 10 MG/ML IV BOLUS
INTRAVENOUS | Status: AC
  Filled 2017-01-03: qty 40

## 2017-01-03 MED ORDER — DIPHENHYDRAMINE HCL 12.5 MG/5ML PO ELIX: 12.5000 mg | ORAL_SOLUTION | ORAL | Status: DC | PRN

## 2017-01-03 MED ORDER — CEFAZOLIN SODIUM-DEXTROSE 2-4 GM/100ML-% IV SOLN
2.0000 g | INTRAVENOUS | Status: AC
  Administered 2017-01-03: 2 g via INTRAVENOUS

## 2017-01-03 MED ORDER — ACETAMINOPHEN 650 MG RE SUPP: 650.0000 mg | Freq: Four times a day (QID) | RECTAL | Status: DC | PRN

## 2017-01-03 MED ORDER — ONDANSETRON HCL 4 MG PO TABS: 4.0000 mg | ORAL_TABLET | Freq: Four times a day (QID) | ORAL | Status: DC | PRN

## 2017-01-03 MED ORDER — PHENOL 1.4 % MT LIQD: 1.0000 | OROMUCOSAL | Status: DC | PRN

## 2017-01-03 MED ORDER — ACETAMINOPHEN 10 MG/ML IV SOLN
INTRAVENOUS | Status: AC
  Filled 2017-01-03: qty 100

## 2017-01-03 MED ORDER — FENTANYL CITRATE (PF) 100 MCG/2ML IJ SOLN
INTRAMUSCULAR | Status: AC
  Filled 2017-01-03: qty 2

## 2017-01-03 MED ORDER — BUPIVACAINE HCL (PF) 0.5 % IJ SOLN
INTRAMUSCULAR | Status: AC
  Filled 2017-01-03: qty 30

## 2017-01-03 MED ORDER — OXYCODONE HCL 5 MG PO TABS
10.0000 mg | ORAL_TABLET | ORAL | Status: DC | PRN
  Administered 2017-01-04 (×3): 20 mg via ORAL
  Filled 2017-01-03 (×3): qty 4

## 2017-01-03 MED ORDER — HYDROMORPHONE HCL-NACL 0.5-0.9 MG/ML-% IV SOSY
0.5000 mg | PREFILLED_SYRINGE | INTRAVENOUS | Status: DC | PRN
  Administered 2017-01-03: 1 mg via INTRAVENOUS
  Filled 2017-01-03: qty 2

## 2017-01-03 MED ORDER — DEXAMETHASONE SODIUM PHOSPHATE 10 MG/ML IJ SOLN
INTRAMUSCULAR | Status: AC
  Filled 2017-01-03: qty 1

## 2017-01-03 MED ORDER — FENTANYL CITRATE (PF) 100 MCG/2ML IJ SOLN
INTRAMUSCULAR | Status: DC | PRN
  Administered 2017-01-03 (×2): 50 ug via INTRAVENOUS

## 2017-01-03 MED ORDER — PROPOFOL 500 MG/50ML IV EMUL: INTRAVENOUS | Status: DC | PRN

## 2017-01-03 MED ORDER — METOCLOPRAMIDE HCL 5 MG PO TABS: 5.0000 mg | ORAL_TABLET | Freq: Three times a day (TID) | ORAL | Status: DC | PRN

## 2017-01-03 MED ORDER — LACTATED RINGERS IV SOLN
INTRAVENOUS | Status: DC
  Administered 2017-01-03 (×3): via INTRAVENOUS

## 2017-01-03 MED ORDER — ACETAMINOPHEN 10 MG/ML IV SOLN
1000.0000 mg | Freq: Once | INTRAVENOUS | Status: AC
  Administered 2017-01-03: 1000 mg via INTRAVENOUS

## 2017-01-03 MED ORDER — PROPOFOL 500 MG/50ML IV EMUL
INTRAVENOUS | Status: DC | PRN
  Administered 2017-01-03: 50 ug/kg/min via INTRAVENOUS

## 2017-01-03 MED ORDER — DEXAMETHASONE SODIUM PHOSPHATE 10 MG/ML IJ SOLN
10.0000 mg | Freq: Once | INTRAMUSCULAR | Status: AC
  Administered 2017-01-03: 10 mg via INTRAVENOUS

## 2017-01-03 MED ORDER — MIDAZOLAM HCL 5 MG/5ML IJ SOLN
INTRAMUSCULAR | Status: DC | PRN
  Administered 2017-01-03 (×2): 1 mg via INTRAVENOUS

## 2017-01-03 MED ORDER — DOCUSATE SODIUM 100 MG PO CAPS
100.0000 mg | ORAL_CAPSULE | Freq: Two times a day (BID) | ORAL | Status: DC
  Administered 2017-01-03: 22:00:00 100 mg via ORAL
  Filled 2017-01-03 (×2): qty 1

## 2017-01-03 MED ORDER — METHOCARBAMOL 500 MG PO TABS
500.0000 mg | ORAL_TABLET | Freq: Four times a day (QID) | ORAL | Status: DC | PRN
  Administered 2017-01-03 – 2017-01-04 (×2): 500 mg via ORAL
  Filled 2017-01-03 (×3): qty 1

## 2017-01-03 MED ORDER — ONDANSETRON HCL 4 MG/2ML IJ SOLN
4.0000 mg | Freq: Four times a day (QID) | INTRAMUSCULAR | Status: DC | PRN
  Administered 2017-01-03 – 2017-01-04 (×2): 4 mg via INTRAVENOUS
  Filled 2017-01-03 (×2): qty 2

## 2017-01-03 MED ORDER — FENTANYL CITRATE (PF) 100 MCG/2ML IJ SOLN
INTRAMUSCULAR | Status: AC
  Administered 2017-01-03: 50 ug via INTRAVENOUS
  Filled 2017-01-03: qty 4

## 2017-01-03 MED ORDER — MENTHOL 3 MG MT LOZG: 1.0000 | LOZENGE | OROMUCOSAL | Status: DC | PRN

## 2017-01-03 MED ORDER — TRANEXAMIC ACID 1000 MG/10ML IV SOLN
1000.0000 mg | INTRAVENOUS | Status: AC
  Administered 2017-01-03: 1000 mg via INTRAVENOUS
  Filled 2017-01-03: qty 1100

## 2017-01-03 MED ORDER — MIDAZOLAM HCL 2 MG/2ML IJ SOLN
INTRAMUSCULAR | Status: AC
  Filled 2017-01-03: qty 2

## 2017-01-03 SURGICAL SUPPLY — 35 items
BAG DECANTER FOR FLEXI CONT (MISCELLANEOUS) IMPLANT
BAG ZIPLOCK 12X15 (MISCELLANEOUS) ×3 IMPLANT
BLADE SAG 18X100X1.27 (BLADE) ×3 IMPLANT
CAPT HIP TOTAL 2 ×3 IMPLANT
CLOSURE WOUND 1/2 X4 (GAUZE/BANDAGES/DRESSINGS) ×2
CLOTH BEACON ORANGE TIMEOUT ST (SAFETY) ×3 IMPLANT
COVER PERINEAL POST (MISCELLANEOUS) ×3 IMPLANT
COVER SURGICAL LIGHT HANDLE (MISCELLANEOUS) ×3 IMPLANT
DECANTER SPIKE VIAL GLASS SM (MISCELLANEOUS) IMPLANT
DRAPE STERI IOBAN 125X83 (DRAPES) ×3 IMPLANT
DRAPE U-SHAPE 47X51 STRL (DRAPES) ×6 IMPLANT
DRSG ADAPTIC 3X8 NADH LF (GAUZE/BANDAGES/DRESSINGS) ×3 IMPLANT
DRSG MEPILEX BORDER 4X4 (GAUZE/BANDAGES/DRESSINGS) ×3 IMPLANT
DRSG MEPILEX BORDER 4X8 (GAUZE/BANDAGES/DRESSINGS) IMPLANT
DURAPREP 26ML APPLICATOR (WOUND CARE) ×3 IMPLANT
ELECT REM PT RETURN 15FT ADLT (MISCELLANEOUS) ×3 IMPLANT
EVACUATOR 1/8 PVC DRAIN (DRAIN) ×3 IMPLANT
GLOVE BIO SURGEON STRL SZ7.5 (GLOVE) ×3 IMPLANT
GLOVE BIO SURGEON STRL SZ8 (GLOVE) ×6 IMPLANT
GLOVE BIOGEL PI IND STRL 8 (GLOVE) ×2 IMPLANT
GLOVE BIOGEL PI INDICATOR 8 (GLOVE) ×4
GOWN STRL REUS W/TWL LRG LVL3 (GOWN DISPOSABLE) ×3 IMPLANT
GOWN STRL REUS W/TWL XL LVL3 (GOWN DISPOSABLE) ×3 IMPLANT
PACK ANTERIOR HIP CUSTOM (KITS) ×3 IMPLANT
STRIP CLOSURE SKIN 1/2X4 (GAUZE/BANDAGES/DRESSINGS) ×4 IMPLANT
SUT ETHIBOND NAB CT1 #1 30IN (SUTURE) ×3 IMPLANT
SUT MNCRL AB 4-0 PS2 18 (SUTURE) ×3 IMPLANT
SUT STRATAFIX 0 PDS 27 VIOLET (SUTURE) ×3
SUT VIC AB 2-0 CT1 27 (SUTURE) ×4
SUT VIC AB 2-0 CT1 TAPERPNT 27 (SUTURE) ×2 IMPLANT
SUTURE STRATFX 0 PDS 27 VIOLET (SUTURE) ×1 IMPLANT
SYR 50ML LL SCALE MARK (SYRINGE) IMPLANT
TRAY FOLEY W/METER SILVER 16FR (SET/KITS/TRAYS/PACK) ×3 IMPLANT
WATER STERILE IRR 1000ML POUR (IV SOLUTION) ×6 IMPLANT
YANKAUER SUCT BULB TIP 10FT TU (MISCELLANEOUS) ×3 IMPLANT

## 2017-01-03 NOTE — Op Note (Signed)
OPERATIVE REPORT- TOTAL HIP ARTHROPLASTY   PREOPERATIVE DIAGNOSIS: Osteoarthritis of the Left hip.   POSTOPERATIVE DIAGNOSIS: Osteoarthritis of the Left  hip.   PROCEDURE: Left total hip arthroplasty, anterior approach.   SURGEON: Ollen Gross, MD   ASSISTANT: Avel Peace, PA-C  ANESTHESIA:  Spinal  ESTIMATED BLOOD LOSS:-450 ml   DRAINS: Hemovac x1.   COMPLICATIONS: None   CONDITION: PACU - hemodynamically stable.   BRIEF CLINICAL NOTE: Sergio Harper is a 51 y.o. male who has advanced end-  stage arthritis of their Left  hip with progressively worsening pain and  dysfunction.The patient has failed nonoperative management and presents for  total hip arthroplasty.   PROCEDURE IN DETAIL: After successful administration of spinal  anesthetic, the traction boots for the [facility] bed were placed on both  feet and the patient was placed onto the [facility] bed, boots placed into the leg  holders. The Left hip was then isolated from the perineum with plastic  drapes and prepped and draped in the usual sterile fashion. ASIS and  greater trochanter were marked and a oblique incision was made, starting  at about 1 cm lateral and 2 cm distal to the ASIS and coursing towards  the anterior cortex of the femur. The skin was cut with a 10 blade  through subcutaneous tissue to the level of the fascia overlying the  tensor fascia lata muscle. The fascia was then incised in line with the  incision at the junction of the anterior third and posterior 2/3rd. The  muscle was teased off the fascia and then the interval between the TFL  and the rectus was developed. The Hohmann retractor was then placed at  the top of the femoral neck over the capsule. The vessels overlying the  capsule were cauterized and the fat on top of the capsule was removed.  A Hohmann retractor was then placed anterior underneath the rectus  femoris to give exposure to the entire anterior capsule. A T-shaped   capsulotomy was performed. The edges were tagged and the femoral head  was identified.       Osteophytes are removed off the superior acetabulum.  The femoral neck was then cut in situ with an oscillating saw. Traction  was then applied to the left lower extremity utilizing the [facility]  traction. The femoral head was then removed. Retractors were placed  around the acetabulum and then circumferential removal of the labrum was  performed. Osteophytes were also removed. Reaming starts at 49 mm to  medialize and  Increased in 2 mm increments to 53 mm. We reamed in  approximately 40 degrees of abduction, 20 degrees anteversion. A 54 mm  pinnacle acetabular shell was then impacted in anatomic position under  fluoroscopic guidance with excellent purchase. We did not need to place  any additional dome screws. A 36 mm neutral + 4 marathon liner was then  placed into the acetabular shell.       The femoral lift was then placed along the lateral aspect of the femur  just distal to the vastus ridge. The leg was  externally rotated and capsule  was stripped off the inferior aspect of the femoral neck down to the  level of the lesser trochanter, this was done with electrocautery. The femur was lifted after this was performed. The  leg was then placed in an extended and adducted position essentially delivering the femur. We also removed the capsule superiorly and the piriformis from the piriformis fossa to  gain excellent exposure of the  proximal femur. Rongeur was used to remove some cancellous bone to get  into the lateral portion of the proximal femur for placement of the  initial starter reamer. The starter broaches was placed  the starter broach  and was shown to go down the center of the canal. Broaching  with the  Corail system was then performed starting at size 8, coursing  Up to size 10. A size 10 had excellent torsional and rotational  and axial stability. The trial high offset neck was then  placed  with a 36 + 1.5 trial head. The hip was then reduced. We confirmed that  the stem was in the canal both on AP and lateral x-rays. It also has excellent sizing. The hip was reduced with outstanding stability through full extension and full external rotation.. AP pelvis was taken and the leg lengths were measured and found to be equal. Hip was then dislocated again and the femoral head and neck removed. The  femoral broach was removed. Size 10 Corail stem with a high offset  neck was then impacted into the femur following native anteversion. Has  excellent purchase in the canal. Excellent torsional and rotational and  axial stability. It is confirmed to be in the canal on AP and lateral  fluoroscopic views. The 36 + 1.5 ceramic head was placed and the hip  reduced with outstanding stability. Again AP pelvis was taken and it  confirmed that the leg lengths were equal. The wound was then copiously  irrigated with saline solution and the capsule reattached and repaired  with Ethibond suture. 30 ml of .25% Bupivicaine was  injected into the capsule and into the edge of the tensor fascia lata as well as subcutaneous tissue. The fascia overlying the tensor fascia lata was then closed with a running #1 V-Loc. Subcu was closed with interrupted 2-0 Vicryl and subcuticular running 4-0 Monocryl. Incision was cleaned  and dried. Steri-Strips and a bulky sterile dressing applied. Hemovac  drain was hooked to suction and then the patient was awakened and transported to  recovery in stable condition.        Please note that a surgical assistant was a medical necessity for this procedure to perform it in a safe and expeditious manner. Assistant was necessary to provide appropriate retraction of vital neurovascular structures and to prevent femoral fracture and allow for anatomic placement of the prosthesis.  Ollen Gross, M.D.

## 2017-01-03 NOTE — Anesthesia Postprocedure Evaluation (Signed)
Anesthesia Post Note  Patient: Sergio Harper  Procedure(s) Performed: Procedure(s) (LRB): LEFT TOTAL HIP ARTHROPLASTY ANTERIOR APPROACH (Left)     Patient location during evaluation: PACU Anesthesia Type: Spinal Level of consciousness: awake and alert Pain management: pain level controlled Vital Signs Assessment: post-procedure vital signs reviewed and stable Respiratory status: spontaneous breathing and respiratory function stable Cardiovascular status: blood pressure returned to baseline and stable Postop Assessment: spinal receding Anesthetic complications: no    Last Vitals:  Vitals:   01/03/17 1330 01/03/17 1348  BP: 112/69 116/74  Pulse: (!) 58 (!) 49  Resp: 12 14  Temp:  (!) 36.4 C  SpO2: 99% 100%    Last Pain:  Vitals:   01/03/17 1330  TempSrc:   PainSc: 0-No pain                 Kathy Wahid,W. EDMOND

## 2017-01-03 NOTE — Interval H&P Note (Signed)
History and Physical Interval Note:  01/03/2017 8:37 AM  Sergio Harper  has presented today for surgery, with the diagnosis of Osteoarthritis Left hip  The various methods of treatment have been discussed with the patient and family. After consideration of risks, benefits and other options for treatment, the patient has consented to  Procedure(s): LEFT TOTAL HIP ARTHROPLASTY ANTERIOR APPROACH (Left) as a surgical intervention .  The patient's history has been reviewed, patient examined, no change in status, stable for surgery.  I have reviewed the patient's chart and labs.  Questions were answered to the patient's satisfaction.     Loanne Drilling
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
## 2017-01-03 NOTE — Discharge Instructions (Addendum)
° °Dr. Frank Aluisio °Total Joint Specialist °[facility] °[address] °[place], [place] [address] °[phone] ° °ANTERIOR APPROACH TOTAL HIP REPLACEMENT POSTOPERATIVE DIRECTIONS ° ° °Hip Rehabilitation, Guidelines Following Surgery  °The results of a hip operation are greatly improved after range of motion and muscle strengthening exercises. Follow all safety measures which are given to protect your hip. If any of these exercises cause increased pain or swelling in your joint, decrease the amount until you are comfortable again. Then slowly increase the exercises. Call your caregiver if you have problems or questions.  ° °HOME CARE INSTRUCTIONS  °Remove items at home which could result in a fall. This includes throw rugs or furniture in walking pathways.  °· ICE to the affected hip every three hours for 30 minutes at a time and then as needed for pain and swelling.  Continue to use ice on the hip for pain and swelling from surgery. You may notice swelling that will progress down to the foot and ankle.  This is normal after surgery.  Elevate the leg when you are not up walking on it.   °· Continue to use the breathing machine which will help keep your temperature down.  It is common for your temperature to cycle up and down following surgery, especially at night when you are not up moving around and exerting yourself.  The breathing machine keeps your lungs expanded and your temperature down. ° ° °DIET °You may resume your previous home diet once your are discharged from the hospital. ° °DRESSING / WOUND CARE / SHOWERING °You may shower 3 days after surgery, but keep the wounds dry during showering.  You may use an occlusive plastic wrap (Press'n Seal for example), NO SOAKING/SUBMERGING IN THE BATHTUB.  If the bandage gets wet, change with a clean dry gauze.  If the incision gets wet, pat the wound dry with a clean towel. °You may start showering once you are discharged home but do not  submerge the incision under water. Just pat the incision dry and apply a dry gauze dressing on daily. °Change the surgical dressing daily and reapply a dry dressing each time. ° °ACTIVITY °Walk with your walker as instructed. °Use walker as long as suggested by your caregivers. °Avoid periods of inactivity such as sitting longer than an hour when not asleep. This helps prevent blood clots.  °You may resume a sexual relationship in one month or when given the OK by your doctor.  °You may return to work once you are cleared by your doctor.  °Do not drive a car for 6 weeks or until released by you surgeon.  °Do not drive while taking narcotics. ° °WEIGHT BEARING °Weight bearing as tolerated with assist device (walker, cane, etc) as directed, use it as long as suggested by your surgeon or therapist, typically at least 4-6 weeks. ° °POSTOPERATIVE CONSTIPATION PROTOCOL °Constipation - defined medically as fewer than three stools per week and severe constipation as less than one stool per week. ° °One of the most common issues patients have following surgery is constipation.  Even if you have a regular bowel pattern at home, your normal regimen is likely to be disrupted due to multiple reasons following surgery.  Combination of anesthesia, postoperative narcotics, change in appetite and fluid intake all can affect your bowels.  In order to avoid complications following surgery, here are some recommendations in order to help you during your recovery period. ° °Colace (docusate) - Pick up an over-the-counter   form of Colace or another stool softener and take twice a day as long as you are requiring postoperative pain medications.  Take with a full glass of water daily.  If you experience loose stools or diarrhea, hold the colace until you stool forms back up.  If your symptoms do not get better within 1 week or if they get worse, check with your doctor. ° °Dulcolax (bisacodyl) - Pick up over-the-counter and take as directed  by the product packaging as needed to assist with the movement of your bowels.  Take with a full glass of water.  Use this product as needed if not relieved by Colace only.  ° °MiraLax (polyethylene glycol) - Pick up over-the-counter to have on hand.  MiraLax is a solution that will increase the amount of water in your bowels to assist with bowel movements.  Take as directed and can mix with a glass of water, juice, soda, coffee, or tea.  Take if you go more than two days without a movement. °Do not use MiraLax more than once per day. Call your doctor if you are still constipated or irregular after using this medication for 7 days in a row. ° °If you continue to have problems with postoperative constipation, please contact the office for further assistance and recommendations.  If you experience "the worst abdominal pain ever" or develop nausea or vomiting, please contact the office immediatly for further recommendations for treatment. ° °ITCHING ° If you experience itching with your medications, try taking only a single pain pill, or even half a pain pill at a time.  You can also use Benadryl over the counter for itching or also to help with sleep.  ° °TED HOSE STOCKINGS °Wear the elastic stockings on both legs for three weeks following surgery during the day but you may remove then at night for sleeping. ° °MEDICATIONS °See your medication summary on the “After Visit Summary” that the nursing staff will review with you prior to discharge.  You may have some home medications which will be placed on hold until you complete the course of blood thinner medication.  It is important for you to complete the blood thinner medication as prescribed by your surgeon.  Continue your approved medications as instructed at time of discharge. ° °PRECAUTIONS °If you experience chest pain or shortness of breath - call 911 immediately for transfer to the hospital emergency department.  °If you develop a fever greater that 101 F,  purulent drainage from wound, increased redness or drainage from wound, foul odor from the wound/dressing, or calf pain - CONTACT YOUR SURGEON.   °                                                °FOLLOW-UP APPOINTMENTS °Make sure you keep all of your appointments after your operation with your surgeon and caregivers. You should call the office at the above phone number and make an appointment for approximately two weeks after the date of your surgery or on the date instructed by your surgeon outlined in the "After Visit Summary". ° °RANGE OF MOTION AND STRENGTHENING EXERCISES  °These exercises are designed to help you keep full movement of your hip joint. Follow your caregiver's or physical therapist's instructions. Perform all exercises about fifteen times, three times per day or as directed. Exercise both hips, even if you   have had only one joint replacement. These exercises can be done on a training (exercise) mat, on the floor, on a table or on a bed. Use whatever works the best and is most comfortable for you. Use music or television while you are exercising so that the exercises are a pleasant break in your day. This will make your life better with the exercises acting as a break in routine you can look forward to.  °Lying on your back, slowly slide your foot toward your buttocks, raising your knee up off the floor. Then slowly slide your foot back down until your leg is straight again.  °Lying on your back spread your legs as far apart as you can without causing discomfort.  °Lying on your side, raise your upper leg and foot straight up from the floor as far as is comfortable. Slowly lower the leg and repeat.  °Lying on your back, tighten up the muscle in the front of your thigh (quadriceps muscles). You can do this by keeping your leg straight and trying to raise your heel off the floor. This helps strengthen the largest muscle supporting your knee.  °Lying on your back, tighten up the muscles of your  buttocks both with the legs straight and with the knee bent at a comfortable angle while keeping your heel on the floor.  ° °IF YOU ARE TRANSFERRED TO A SKILLED REHAB FACILITY °If the patient is transferred to a skilled rehab facility following release from the hospital, a list of the current medications will be sent to the facility for the patient to continue.  When discharged from the skilled rehab facility, please have the facility set up the patient's Home Health Physical Therapy prior to being released. Also, the skilled facility will be responsible for providing the patient with their medications at time of release from the facility to include their pain medication, the muscle relaxants, and their blood thinner medication. If the patient is still at the rehab facility at time of the two week follow up appointment, the skilled rehab facility will also need to assist the patient in arranging follow up appointment in our office and any transportation needs. ° °MAKE SURE YOU:  °Understand these instructions.  °Get help right away if you are not doing well or get worse.  ° ° °Pick up stool softner and laxative for home use following surgery while on pain medications. °Do not submerge incision under water. °Please use good hand washing techniques while changing dressing each day. °May shower starting three days after surgery. °Please use a clean towel to pat the incision dry following showers. °Continue to use ice for pain and swelling after surgery. °Do not use any lotions or creams on the incision until instructed by your surgeon. ° °Take Xarelto for two and a half more weeks following discharge from the hospital, then discontinue Xarelto. °Once the patient has completed the blood thinner regimen, then take a Baby 81 mg Aspirin daily for three more weeks. ° ° ° ° °Information on my medicine - XARELTO® (Rivaroxaban) ° °Why was Xarelto® prescribed for you? °Xarelto® was prescribed for you to reduce the risk of blood  clots forming after orthopedic surgery. The medical term for these abnormal blood clots is venous thromboembolism (VTE). ° °What do you need to know about xarelto® ? °Take your Xarelto® ONCE DAILY at the same time every day. °You may take it either with or without food. ° °If you have difficulty swallowing the tablet whole, you may   crush it and mix in applesauce just prior to taking your dose. ° °Take Xarelto® exactly as prescribed by your doctor and DO NOT stop taking Xarelto® without talking to the doctor who prescribed the medication.  Stopping without other VTE prevention medication to take the place of Xarelto® may increase your risk of developing a clot. ° °After discharge, you should have regular check-up appointments with your healthcare provider that is prescribing your Xarelto®.   ° °What do you do if you miss a dose? °If you miss a dose, take it as soon as you remember on the same day then continue your regularly scheduled once daily regimen the next day. Do not take two doses of Xarelto® on the same day.  ° °Important Safety Information °A possible side effect of Xarelto® is bleeding. You should call your healthcare provider right away if you experience any of the following: °? Bleeding from an injury or your nose that does not stop. °? Unusual colored urine (red or dark brown) or unusual colored stools (red or black). °? Unusual bruising for unknown reasons. °? A serious fall or if you hit your head (even if there is no bleeding). ° °Some medicines may interact with Xarelto® and might increase your risk of bleeding while on Xarelto®. To help avoid this, consult your healthcare provider or pharmacist prior to using any new prescription or non-prescription medications, including herbals, vitamins, non-steroidal anti-inflammatory drugs (NSAIDs) and supplements. ° °This website has more information on Xarelto®: www.xarelto.com. ° ° ° °

## 2017-01-03 NOTE — Anesthesia Procedure Notes (Signed)
Spinal  Patient location during procedure: OR Start time: 01/03/2017 9:53 AM End time: 01/03/2017 10:57 AM Staffing Anesthesiologist: Gaynelle Adu Resident/CRNA: Lyda Kalata R Performed: resident/CRNA  Preanesthetic Checklist Completed: patient identified, site marked, surgical consent, pre-op evaluation, timeout performed, IV checked, risks and benefits discussed and monitors and equipment checked Spinal Block Patient position: sitting Prep: DuraPrep Patient monitoring: heart rate, cardiac monitor, continuous pulse ox and blood pressure Approach: midline Location: L3-4 Injection technique: single-shot Needle Needle type: Pencan  Needle gauge: 25 G Needle length: 10 cm Needle insertion depth: 8 cm Assessment Sensory level: T6

## 2017-01-03 NOTE — Transfer of Care (Signed)
Immediate Anesthesia Transfer of Care Note  Patient: Sergio Harper  Procedure(s) Performed: Procedure(s): LEFT TOTAL HIP ARTHROPLASTY ANTERIOR APPROACH (Left)  Patient Location: PACU  Anesthesia Type:Spinal  Level of Consciousness:  sedated, patient cooperative and responds to stimulation  Airway & Oxygen Therapy:Patient Spontanous Breathing and Patient connected to face mask oxgen  Post-op Assessment:  Report given to PACU RN and Post -op Vital signs reviewed and stable  Post vital signs:  Reviewed and stable  Last Vitals:  Vitals:   01/03/17 0837  BP: 131/84  Pulse: (!) 57  Resp: 16  Temp: 36.7 C  SpO2: 100%    Complications: No apparent anesthesia complications

## 2017-01-03 NOTE — Anesthesia Preprocedure Evaluation (Addendum)
Anesthesia Evaluation  Patient identified by MRN, date of birth, ID band Patient awake    Reviewed: Allergy & Precautions, H&P , NPO status , Patient's Chart, lab work & pertinent test results  History of Anesthesia Complications (+) PONV  Airway Mallampati: II  TM Distance: >3 FB Neck ROM: Full    Dental no notable dental hx. (+) Teeth Intact, Dental Advisory Given   Pulmonary neg pulmonary ROS, former smoker,    Pulmonary exam normal breath sounds clear to auscultation       Cardiovascular negative cardio ROS   Rhythm:Regular Rate:Normal     Neuro/Psych negative neurological ROS  negative psych ROS   GI/Hepatic negative GI ROS, Neg liver ROS,   Endo/Other  negative endocrine ROS  Renal/GU negative Renal ROS  negative genitourinary   Musculoskeletal  (+) Arthritis , Osteoarthritis,    Abdominal   Peds  Hematology negative hematology ROS (+)   Anesthesia Other Findings   Reproductive/Obstetrics negative OB ROS                            Anesthesia Physical Anesthesia Plan  ASA: II  Anesthesia Plan: Spinal   Post-op Pain Management:    Induction: Intravenous  PONV Risk Score and Plan: 3 and Ondansetron, Dexamethasone, Midazolam and Propofol infusion  Airway Management Planned: Simple Face Mask  Additional Equipment:   Intra-op Plan:   Post-operative Plan:   Informed Consent: I have reviewed the patients History and Physical, chart, labs and discussed the procedure including the risks, benefits and alternatives for the proposed anesthesia with the patient or authorized representative who has indicated his/her understanding and acceptance.   Dental advisory given  Plan Discussed with: CRNA  Anesthesia Plan Comments:         Anesthesia Quick Evaluation

## 2017-01-04 LAB — CBC
HCT: 37.8 % — ABNORMAL LOW (ref 39.0–52.0)
Hemoglobin: 13.6 g/dL (ref 13.0–17.0)
MCH: 30.7 pg (ref 26.0–34.0)
MCHC: 36 g/dL (ref 30.0–36.0)
MCV: 85.3 fL (ref 78.0–100.0)
PLATELETS: 185 10*3/uL (ref 150–400)
RBC: 4.43 MIL/uL (ref 4.22–5.81)
RDW: 12.3 % (ref 11.5–15.5)
WBC: 17.4 10*3/uL — AB (ref 4.0–10.5)

## 2017-01-04 LAB — BASIC METABOLIC PANEL
Anion gap: 10 (ref 5–15)
BUN: 13 mg/dL (ref 6–20)
CALCIUM: 8.5 mg/dL — AB (ref 8.9–10.3)
CO2: 24 mmol/L (ref 22–32)
CREATININE: 0.95 mg/dL (ref 0.61–1.24)
Chloride: 102 mmol/L (ref 101–111)
GFR calc Af Amer: >60 mL/min (ref >60)
GLUCOSE: 154 mg/dL — AB (ref 65–99)
Potassium: 3.7 mmol/L (ref 3.5–5.1)
Sodium: 136 mmol/L (ref 135–145)

## 2017-01-04 MED ORDER — OXYCODONE HCL 10 MG PO TABS: 10.0000 mg | ORAL_TABLET | ORAL | 0 refills | Status: AC | PRN

## 2017-01-04 MED ORDER — METHOCARBAMOL 500 MG PO TABS: 500.0000 mg | ORAL_TABLET | Freq: Four times a day (QID) | ORAL | 0 refills | Status: AC | PRN

## 2017-01-04 MED ORDER — TRAMADOL HCL 50 MG PO TABS: 50.0000 mg | ORAL_TABLET | Freq: Four times a day (QID) | ORAL | 0 refills | Status: AC | PRN

## 2017-01-04 MED ORDER — RIVAROXABAN 10 MG PO TABS: 10.0000 mg | ORAL_TABLET | Freq: Every day | ORAL | 0 refills | Status: AC

## 2017-01-04 NOTE — Progress Notes (Signed)
   Subjective: 1 Day Post-Op Procedure(s) (LRB): LEFT TOTAL HIP ARTHROPLASTY ANTERIOR APPROACH (Left) Patient reports pain as mild and moderate.   Patient seen in rounds by Dr. Lequita Halt. Patient is well, but has had some minor complaints of pain in the hip, requiring pain medications We will start therapy today.  If they do well with therapy and meets all goals, then will allow home later this afternoon following therapy. Plan is to go Home after hospital stay.  Objective: Vital signs in last 24 hours: Temp:  [97.5 F (36.4 C)-99.1 F (37.3 C)] 98.2 F (36.8 C) (08/23 0600) Pulse Rate:  [47-74] 74 (08/23 0600) Resp:  [9-18] 18 (08/23 0600) BP: (100-131)/(55-84) 118/70 (08/23 0600) SpO2:  [96 %-100 %] 100 % (08/23 0600) Weight:  [112 kg (247 lb)] 112 kg (247 lb) (08/22 0837)  Intake/Output from previous day:  Intake/Output Summary (Last 24 hours) at 01/04/17 0834 Last data filed at 01/04/17 0748  Gross per 24 hour  Intake             4525 ml  Output             4060 ml  Net              465 ml    Intake/Output this shift: Total I/O In: 240 [P.O.:240] Out: -   Labs:  Recent Labs  01/04/17 0559  HGB 13.6    Recent Labs  01/04/17 0559  WBC 17.4*  RBC 4.43  HCT 37.8*  PLT 185    Recent Labs  01/04/17 0559  NA 136  K 3.7  CL 102  CO2 24  BUN 13  CREATININE 0.95  GLUCOSE 154*  CALCIUM 8.5*   No results for input(s): LABPT, INR in the last 72 hours.  EXAM General - Patient is Alert and Appropriate Extremity - Neurovascular intact Sensation intact distally Intact pulses distally Dressing - dressing C/D/I Motor Function - intact, moving foot and toes well on exam.  Hemovac pulled without difficulty.  Past Medical History:  Diagnosis Date  . Arthritis   . GERD (gastroesophageal reflux disease)   . PONV (postoperative nausea and vomiting)     Assessment/Plan: 1 Day Post-Op Procedure(s) (LRB): LEFT TOTAL HIP ARTHROPLASTY ANTERIOR APPROACH  (Left) Principal Problem:   OA (osteoarthritis) of hip  Estimated body mass index is 31.71 kg/m as calculated from the following:   Height as of this encounter: 6\' 2"  (1.88 m).   Weight as of this encounter: 112 kg (247 lb). Advance diet Up with therapy  DVT Prophylaxis - Xarelto Weight Bearing As Tolerated left Leg Hemovac Pulled Begin Therapy  If meets goals and able to go home: Discharge home - No HHPT - Home Exercise Program - Sheet Provided Diet - Regular diet Follow up - in 2 weeks Activity - WBAT Disposition - Home Condition Upon Discharge - Stable D/C Meds - See DC Summary DVT Prophylaxis - Xarelto  Avel Peace, PA-C Orthopaedic Surgery 01/04/2017, 8:34 AM

## 2017-01-04 NOTE — Progress Notes (Signed)
01/03/17 Nursing 2300 Sergio Harper called reg patient's c/o of severe pain despite prescribed pain meds. Order received to increase oxy to 10-20 q 3h and try dilaudid iv for pain . Will continue to monitor patient.

## 2017-01-04 NOTE — Discharge Summary (Signed)
Physician Discharge Summary   Patient ID: Sergio Harper MRN: [id number] DOB/AGE: [birth year] 51 y.o.  Admit date: 01/03/2017 Discharge date: 01/04/2017  Primary Diagnosis:  Osteoarthritis of the Left hip.  Admission Diagnoses:  Past Medical History:  Diagnosis Date  . Arthritis   . GERD (gastroesophageal reflux disease)   . PONV (postoperative nausea and vomiting)    Discharge Diagnoses:   Principal Problem:   OA (osteoarthritis) of hip  Estimated body mass index is 31.71 kg/m as calculated from the following:   Height as of this encounter: '6\' 2"'$  (1.88 m).   Weight as of this encounter: 112 kg (247 lb).  Procedure(s) (LRB): LEFT TOTAL HIP ARTHROPLASTY ANTERIOR APPROACH (Left)   Consults: None  HPI:  Laboratory Data: Admission on 01/03/2017  Component Date Value Ref Range Status  . WBC 01/04/2017 17.4* 4.0 - 10.5 K/uL Final  . RBC 01/04/2017 4.43  4.22 - 5.81 MIL/uL Final  . Hemoglobin 01/04/2017 13.6  13.0 - 17.0 g/dL Final  . HCT 01/04/2017 37.8* 39.0 - 52.0 % Final  . MCV 01/04/2017 85.3  78.0 - 100.0 fL Final  . MCH 01/04/2017 30.7  26.0 - 34.0 pg Final  . MCHC 01/04/2017 36.0  30.0 - 36.0 g/dL Final  . RDW 01/04/2017 12.3  11.5 - 15.5 % Final  . Platelets 01/04/2017 185  150 - 400 K/uL Final  . Sodium 01/04/2017 136  135 - 145 mmol/L Final  . Potassium 01/04/2017 3.7  3.5 - 5.1 mmol/L Final  . Chloride 01/04/2017 102  101 - 111 mmol/L Final  . CO2 01/04/2017 24  22 - 32 mmol/L Final  . Glucose, Bld 01/04/2017 154* 65 - 99 mg/dL Final  . BUN 01/04/2017 13  6 - 20 mg/dL Final  . Creatinine, Ser 01/04/2017 0.95  0.61 - 1.24 mg/dL Final  . Calcium 01/04/2017 8.5* 8.9 - 10.3 mg/dL Final  . GFR calc non Af Amer 01/04/2017 >60  >60 mL/min Final  . GFR calc Af Amer 01/04/2017 >60  >60 mL/min Final   Comment: (NOTE) The eGFR has been calculated using the CKD EPI equation. This calculation has not been validated in all clinical situations. eGFR's persistently <60  mL/min signify possible Chronic Kidney Disease.   Georgiann Hahn gap 01/04/2017 10  5 - 15 Final  Hospital Outpatient Visit on 12/26/2016  Component Date Value Ref Range Status  . aPTT 12/26/2016 31  24 - 36 seconds Final  . WBC 12/26/2016 6.3  4.0 - 10.5 K/uL Final  . RBC 12/26/2016 4.83  4.22 - 5.81 MIL/uL Final  . Hemoglobin 12/26/2016 14.9  13.0 - 17.0 g/dL Final  . HCT 12/26/2016 42.2  39.0 - 52.0 % Final  . MCV 12/26/2016 87.4  78.0 - 100.0 fL Final  . MCH 12/26/2016 30.8  26.0 - 34.0 pg Final  . MCHC 12/26/2016 35.3  30.0 - 36.0 g/dL Final  . RDW 12/26/2016 12.5  11.5 - 15.5 % Final  . Platelets 12/26/2016 164  150 - 400 K/uL Final  . Sodium 12/26/2016 140  135 - 145 mmol/L Final  . Potassium 12/26/2016 4.7  3.5 - 5.1 mmol/L Final  . Chloride 12/26/2016 106  101 - 111 mmol/L Final  . CO2 12/26/2016 28  22 - 32 mmol/L Final  . Glucose, Bld 12/26/2016 83  65 - 99 mg/dL Final  . BUN 12/26/2016 18  6 - 20 mg/dL Final  . Creatinine, Ser 12/26/2016 1.14  0.61 - 1.24 mg/dL Final  . Calcium 12/26/2016  9.4  8.9 - 10.3 mg/dL Final  . Total Protein 12/26/2016 6.6  6.5 - 8.1 g/dL Final  . Albumin 12/26/2016 4.3  3.5 - 5.0 g/dL Final  . AST 12/26/2016 30  15 - 41 U/L Final  . ALT 12/26/2016 29  17 - 63 U/L Final  . Alkaline Phosphatase 12/26/2016 55  38 - 126 U/L Final  . Total Bilirubin 12/26/2016 0.8  0.3 - 1.2 mg/dL Final  . GFR calc non Af Amer 12/26/2016 >60  >60 mL/min Final  . GFR calc Af Amer 12/26/2016 >60  >60 mL/min Final   Comment: (NOTE) The eGFR has been calculated using the CKD EPI equation. This calculation has not been validated in all clinical situations. eGFR's persistently <60 mL/min signify possible Chronic Kidney Disease.   . Anion gap 12/26/2016 6  5 - 15 Final  . Prothrombin Time 12/26/2016 14.1  11.4 - 15.2 seconds Final  . INR 12/26/2016 1.09   Final  . ABO/RH(D) 12/26/2016 B POS   Final  . Antibody Screen 12/26/2016 NEG   Final  . Sample Expiration 12/26/2016  01/06/2017   Final  . Extend sample reason 12/26/2016 NO TRANSFUSIONS OR PREGNANCY IN THE PAST 3 MONTHS   Final  . MRSA, PCR 12/26/2016 NEGATIVE  NEGATIVE Final  . Staphylococcus aureus 12/26/2016 POSITIVE* NEGATIVE Final   Comment:        The Xpert SA Assay (FDA approved for NASAL specimens in patients over 68 years of age), is one component of a comprehensive surveillance program.  Test performance has been validated by [facility] for patients greater than or equal to 52 year old. It is not intended to diagnose infection nor to guide or monitor treatment.   . ABO/RH(D) 12/26/2016 B POS   Final     X-Rays:Dg Pelvis Portable  Result Date: 01/03/2017 CLINICAL DATA:  Status post right hip replacement EXAM: PORTABLE PELVIS 1-2 VIEWS COMPARISON:  Fluoroscopy from earlier today. FINDINGS: Bilateral total hip arthroplasty. The left-sided arthroplasty is recent, with drain in place. No periprosthetic fracture or dislocation in the AP projection. The right hip arthroplasty shows an asymmetric cup alignment with lucency medially. IMPRESSION: 1. No unexpected finding along the recent left hip arthroplasty. 2. Asymmetric right hip arthroplasty cup alignment with medial lucency. Electronically Signed   By: Monte Fantasia M.D.   On: 01/03/2017 12:47   Dg C-arm 1-60 Min-no Report  Result Date: 01/03/2017 Fluoroscopy was utilized by the requesting physician.  No radiographic interpretation.    EKG:No orders found for this or any previous visit.   Hospital Course: Patient was admitted to [facility] and taken to the OR and underwent the above state procedure without complications.  Patient tolerated the procedure well and was later transferred to the recovery room and then to the orthopaedic floor for postoperative care.  They were given PO and IV analgesics for pain control following their surgery.  They were given 24 hours of postoperative antibiotics of  Anti-infectives    Start      Dose/Rate Route Frequency Ordered Stop   01/03/17 1600  ceFAZolin (ANCEF) IVPB 2g/100 mL premix     2 g 200 mL/hr over 30 Minutes Intravenous Every 6 hours 01/03/17 1354 01/03/17 2143   01/03/17 0816  ceFAZolin (ANCEF) 2-4 GM/100ML-% IVPB    Comments:  Bridget Hartshorn   : cabinet override      01/03/17 0816 01/03/17 1000   01/03/17 0812  ceFAZolin (ANCEF) IVPB 2g/100 mL premix  2 g 200 mL/hr over 30 Minutes Intravenous On call to O.R. 01/03/17 3646 01/03/17 1000     and started on DVT prophylaxis in the form of Xarelto.   PT and OT were ordered for total hip protocol.  The patient was allowed to be WBAT with therapy. Discharge planning was consulted to help with postop disposition and equipment needs.  Patient had a tough night on the evening of surgery with pain.  They started to get up OOB with therapy on day one.  Hemovac drain was pulled without difficulty. Dressing was checked and was clean and dry.  Patient was seen in rounds on day one by Dr. Wynelle Link and was setup to go home later that day depending upon therapy goals.   Discharge home - No HHPT - Home Exercise Program - Sheet Provided Diet - Regular diet Follow up - in 2 weeks Activity - WBAT Disposition - Home Condition Upon Discharge - stable D/C Meds - See DC Summary DVT Prophylaxis - Xarelto  Discharge Instructions    Call MD / Call 911    Complete by:  As directed    If you experience chest pain or shortness of breath, CALL 911 and be transported to the hospital emergency room.  If you develope a fever above 101 F, pus (white drainage) or increased drainage or redness at the wound, or calf pain, call your surgeon's office.   Change dressing    Complete by:  As directed    You may change your dressing dressing daily with sterile 4 x 4 inch gauze dressing and paper tape.  Do not submerge the incision under water.   Constipation Prevention    Complete by:  As directed    Drink plenty of fluids.  Prune juice may be  helpful.  You may use a stool softener, such as Colace (over the counter) 100 mg twice a day.  Use MiraLax (over the counter) for constipation as needed.   Diet - low sodium heart healthy    Complete by:  As directed    Discharge instructions    Complete by:  As directed    Take Xarelto for two and a half more weeks, then discontinue Xarelto. Once the patient has completed the blood thinner regimen, then take a Baby 81 mg Aspirin daily for three more weeks.   Pick up stool softner and laxative for home use following surgery while on pain medications. Do not submerge incision under water. Please use good hand washing techniques while changing dressing each day. May shower starting three days after surgery. Please use a clean towel to pat the incision dry following showers. Continue to use ice for pain and swelling after surgery. Do not use any lotions or creams on the incision until instructed by your surgeon.  Wear both TED hose on both legs during the day every day for three weeks, but may remove the TED hose at night at home.  Postoperative Constipation Protocol  Constipation - defined medically as fewer than three stools per week and severe constipation as less than one stool per week.  One of the most common issues patients have following surgery is constipation.  Even if you have a regular bowel pattern at home, your normal regimen is likely to be disrupted due to multiple reasons following surgery.  Combination of anesthesia, postoperative narcotics, change in appetite and fluid intake all can affect your bowels.  In order to avoid complications following surgery, here are some recommendations in order  to help you during your recovery period.  Colace (docusate) - Pick up an over-the-counter form of Colace or another stool softener and take twice a day as long as you are requiring postoperative pain medications.  Take with a full glass of water daily.  If you experience loose stools or  diarrhea, hold the colace until you stool forms back up.  If your symptoms do not get better within 1 week or if they get worse, check with your doctor.  Dulcolax (bisacodyl) - Pick up over-the-counter and take as directed by the product packaging as needed to assist with the movement of your bowels.  Take with a full glass of water.  Use this product as needed if not relieved by Colace only.   MiraLax (polyethylene glycol) - Pick up over-the-counter to have on hand.  MiraLax is a solution that will increase the amount of water in your bowels to assist with bowel movements.  Take as directed and can mix with a glass of water, juice, soda, coffee, or tea.  Take if you go more than two days without a movement. Do not use MiraLax more than once per day. Call your doctor if you are still constipated or irregular after using this medication for 7 days in a row.  If you continue to have problems with postoperative constipation, please contact the office for further assistance and recommendations.  If you experience "the worst abdominal pain ever" or develop nausea or vomiting, please contact the office immediatly for further recommendations for treatment.   Do not sit on low chairs, stoools or toilet seats, as it may be difficult to get up from low surfaces    Complete by:  As directed    Driving restrictions    Complete by:  As directed    No driving until released by the physician.   Increase activity slowly as tolerated    Complete by:  As directed    Lifting restrictions    Complete by:  As directed    No lifting until released by the physician.   Patient may shower    Complete by:  As directed    You may shower without a dressing once there is no drainage.  Do not wash over the wound.  If drainage remains, do not shower until drainage stops.   TED hose    Complete by:  As directed    Use stockings (TED hose) for 3 weeks on both leg(s).  You may remove them at night for sleeping.   Weight  bearing as tolerated    Complete by:  As directed    Laterality:  left   Extremity:  Lower     Allergies as of 01/04/2017      Reactions   Hydrocodone    Made him laugh a lot per patient and then when getting out of system patient feel really bad    Trazodone Other (See Comments)   Confusion       Medication List    STOP taking these medications   [employer] VITAMIN B12 2000 MCG tablet Generic drug:  cyanocobalamin   EQL VITAMIN D3 2000 units Caps Generic drug:  Cholecalciferol   Maca 500 MG Caps   meloxicam 15 MG tablet Commonly known as:  MOBIC   OVER THE COUNTER MEDICATION   TURMERIC CURCUMIN PO     TAKE these medications   acetaminophen 650 MG CR tablet Commonly known as:  TYLENOL Take 1,300 mg by mouth 2 (two) times  daily as needed for pain.   methocarbamol 500 MG tablet Commonly known as:  ROBAXIN Take 1 tablet (500 mg total) by mouth every 6 (six) hours as needed for muscle spasms.   Oxycodone HCl 10 MG Tabs Take 1-2 tablets (10-20 mg total) by mouth every 4 (four) hours as needed.   rivaroxaban 10 MG Tabs tablet Commonly known as:  XARELTO Take 1 tablet (10 mg total) by mouth daily with breakfast. Take Xarelto for two and a half more weeks following discharge from the hospital, then discontinue Xarelto. Once the patient has completed the blood thinner regimen, then take a Baby 81 mg Aspirin daily for three more weeks.   traMADol 50 MG tablet Commonly known as:  ULTRAM Take 1-2 tablets (50-100 mg total) by mouth every 6 (six) hours as needed for moderate pain.            Discharge Care Instructions        Start     Ordered   01/05/17 0000  rivaroxaban (XARELTO) 10 MG TABS tablet  Daily with breakfast    Question:  Supervising Provider  Answer:  Gaynelle Arabian   01/04/17 0841   01/04/17 0000  methocarbamol (ROBAXIN) 500 MG tablet  Every 6 hours PRN    Question:  Supervising Provider  Answer:  Gaynelle Arabian   01/04/17 0841   01/04/17 0000  Oxycodone  HCl 10 MG TABS  Every 4 hours PRN    Question:  Supervising Provider  Answer:  Gaynelle Arabian   01/04/17 0841   01/04/17 0000  traMADol (ULTRAM) 50 MG tablet  Every 6 hours PRN    Question:  Supervising Provider  Answer:  Gaynelle Arabian   01/04/17 0841   01/04/17 0000  Call MD / Call 911    Comments:  If you experience chest pain or shortness of breath, CALL 911 and be transported to the hospital emergency room.  If you develope a fever above 101 F, pus (white drainage) or increased drainage or redness at the wound, or calf pain, call your surgeon's office.   01/04/17 0841   01/04/17 0000  Discharge instructions    Comments:  Take Xarelto for two and a half more weeks, then discontinue Xarelto. Once the patient has completed the blood thinner regimen, then take a Baby 81 mg Aspirin daily for three more weeks.   Pick up stool softner and laxative for home use following surgery while on pain medications. Do not submerge incision under water. Please use good hand washing techniques while changing dressing each day. May shower starting three days after surgery. Please use a clean towel to pat the incision dry following showers. Continue to use ice for pain and swelling after surgery. Do not use any lotions or creams on the incision until instructed by your surgeon.  Wear both TED hose on both legs during the day every day for three weeks, but may remove the TED hose at night at home.  Postoperative Constipation Protocol  Constipation - defined medically as fewer than three stools per week and severe constipation as less than one stool per week.  One of the most common issues patients have following surgery is constipation.  Even if you have a regular bowel pattern at home, your normal regimen is likely to be disrupted due to multiple reasons following surgery.  Combination of anesthesia, postoperative narcotics, change in appetite and fluid intake all can affect your bowels.  In order to  avoid complications following surgery, here  are some recommendations in order to help you during your recovery period.  Colace (docusate) - Pick up an over-the-counter form of Colace or another stool softener and take twice a day as long as you are requiring postoperative pain medications.  Take with a full glass of water daily.  If you experience loose stools or diarrhea, hold the colace until you stool forms back up.  If your symptoms do not get better within 1 week or if they get worse, check with your doctor.  Dulcolax (bisacodyl) - Pick up over-the-counter and take as directed by the product packaging as needed to assist with the movement of your bowels.  Take with a full glass of water.  Use this product as needed if not relieved by Colace only.   MiraLax (polyethylene glycol) - Pick up over-the-counter to have on hand.  MiraLax is a solution that will increase the amount of water in your bowels to assist with bowel movements.  Take as directed and can mix with a glass of water, juice, soda, coffee, or tea.  Take if you go more than two days without a movement. Do not use MiraLax more than once per day. Call your doctor if you are still constipated or irregular after using this medication for 7 days in a row.  If you continue to have problems with postoperative constipation, please contact the office for further assistance and recommendations.  If you experience "the worst abdominal pain ever" or develop nausea or vomiting, please contact the office immediatly for further recommendations for treatment.   01/04/17 0841   01/04/17 0000  Diet - low sodium heart healthy     01/04/17 0841   01/04/17 0000  Constipation Prevention    Comments:  Drink plenty of fluids.  Prune juice may be helpful.  You may use a stool softener, such as Colace (over the counter) 100 mg twice a day.  Use MiraLax (over the counter) for constipation as needed.   01/04/17 0841   01/04/17 0000  Increase activity slowly as  tolerated     01/04/17 0841   01/04/17 0000  Patient may shower    Comments:  You may shower without a dressing once there is no drainage.  Do not wash over the wound.  If drainage remains, do not shower until drainage stops.   01/04/17 0841   01/04/17 0000  Weight bearing as tolerated    Question Answer Comment  Laterality left   Extremity Lower      01/04/17 0841   01/04/17 0000  Driving restrictions    Comments:  No driving until released by the physician.   01/04/17 0841   01/04/17 0000  Lifting restrictions    Comments:  No lifting until released by the physician.   01/04/17 0841   01/04/17 0000  Change dressing    Comments:  You may change your dressing dressing daily with sterile 4 x 4 inch gauze dressing and paper tape.  Do not submerge the incision under water.   01/04/17 0841   01/04/17 0000  TED hose    Comments:  Use stockings (TED hose) for 3 weeks on both leg(s).  You may remove them at night for sleeping.   01/04/17 0841   01/04/17 0000  Do not sit on low chairs, stoools or toilet seats, as it may be difficult to get up from low surfaces     01/04/17 0841     Follow-up Information    Gaynelle Arabian, MD. Schedule  an appointment as soon as possible for a visit on 01/16/2017.   Specialty:  Orthopedic Surgery Contact information: [address] [place] [address] [phone]           Signed: Arlee Muslim, PA-C Orthopaedic Surgery 01/04/2017, 8:42 AM

## 2017-01-04 NOTE — Progress Notes (Signed)
Physical Therapy Treatment Patient Details Name: Sergio Harper MRN: [id number] DOB: 02/16/1966 Today's Date: 01/04/2017    History of Present Illness Pt s/p L THR and with hx of R THR    PT Comments    Pt moving well and eager for return home.  Spouse present and reviewed home therex, stairs and car transfers.   Follow Up Recommendations  DC plan and follow up therapy as arranged by surgeon     Equipment Recommendations  None recommended by PT    Recommendations for Other Services       Precautions / Restrictions Precautions Precautions: Fall Restrictions Weight Bearing Restrictions: No    Mobility  Bed Mobility               General bed mobility comments: Pt OOB with nursing - reports minimal difficulty  Transfers Overall transfer level: Needs assistance Equipment used: Rolling walker (2 wheeled) Transfers: Sit to/from Stand Sit to Stand: Supervision         General transfer comment: cues for LE management and use of UEs to self assist  Ambulation/Gait Ambulation/Gait assistance: Min guard;Supervision Ambulation Distance (Feet): 200 Feet Assistive device: Rolling walker (2 wheeled) Gait Pattern/deviations: Decreased step length - right;Decreased step length - left;Step-to pattern;Step-through pattern;Shuffle;Trunk flexed;Antalgic Gait velocity: decr Gait velocity interpretation: Below normal speed for age/gender General Gait Details: cues for posture, position from RW and initial sequence   Stairs Stairs: Yes   Stair Management: One rail Left;Step to pattern;Forwards;With crutches Number of Stairs: 4 General stair comments: cues for sequence, foot/crutch placement   Wheelchair Mobility    Modified Rankin (Stroke Patients Only)       Balance Overall balance assessment: No apparent balance deficits (not formally assessed)                                          Cognition Arousal/Alertness: Awake/alert Behavior During  Therapy: WFL for tasks assessed/performed Overall Cognitive Status: Within Functional Limits for tasks assessed                                        Exercises Total Joint Exercises Ankle Circles/Pumps: AROM;Both;15 reps;Supine Quad Sets: AROM;Both;10 reps;Supine Heel Slides: AAROM;Left;15 reps;Supine Hip ABduction/ADduction: AAROM;Left;Supine;10 reps Long Arc Quad: AAROM;AROM;Left;10 reps;Seated    General Comments        Pertinent Vitals/Pain Pain Assessment: 0-10 Pain Score: 2  Pain Location: L hip Pain Descriptors / Indicators: Sore;Tightness Pain Intervention(s): Limited activity within patient's tolerance;Monitored during session;Premedicated before session;Ice applied    Home Living Family/patient expects to be discharged to:: Private residence Living Arrangements: Spouse/significant other Available Help at Discharge: Family Type of Home: House Home Access: Level entry   Home Layout: Two level Home Equipment: Environmental consultant - 2 wheels;Cane - single point;Crutches      Prior Function Level of Independence: Independent          PT Goals (current goals can now be found in the care plan section) Acute Rehab PT Goals Patient Stated Goal: Regain IND and back to work PT Goal Formulation: With patient Time For Goal Achievement: 01/06/17 Potential to Achieve Goals: Good Progress towards PT goals: Progressing toward goals    Frequency    7X/week      PT Plan Current plan remains appropriate    Co-evaluation  AM-PAC PT "6 Clicks" Daily Activity  Outcome Measure  Difficulty turning over in bed (including adjusting bedclothes, sheets and blankets)?: A Lot Difficulty moving from lying on back to sitting on the side of the bed? : A Lot Difficulty sitting down on and standing up from a chair with arms (e.g., wheelchair, bedside commode, etc,.)?: A Lot Help needed moving to and from a bed to chair (including a wheelchair)?: A  Little Help needed walking in hospital room?: A Little Help needed climbing 3-5 steps with a railing? : A Little 6 Click Score: 15    End of Session Equipment Utilized During Treatment: Gait belt Activity Tolerance: Patient tolerated treatment well Patient left: in chair;with call bell/phone within reach;with family/visitor present Nurse Communication: Mobility status PT Visit Diagnosis: Difficulty in walking, not elsewhere classified (R26.2)     Time: 6503-5465 PT Time Calculation (min) (ACUTE ONLY): 23 min  Charges:  $Gait Training: 8-22 mins $Therapeutic Exercise: 8-22 mins $Therapeutic Activity: 8-22 mins                    G Codes:       Pg [phone]    Sergio Harper 01/04/2017, 12:49 PM

## 2017-01-04 NOTE — Evaluation (Signed)
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  Pt should progress well to dc home with family assist.    Follow Up Recommendations DC plan and follow up therapy as arranged by surgeon    Equipment Recommendations  None recommended by PT    Recommendations for Other Services       Precautions / Restrictions Precautions Precautions: Fall Restrictions Weight Bearing Restrictions: No      Mobility  Bed Mobility               General bed mobility comments: Pt OOB with nursing - reports minimal difficulty  Transfers Overall transfer level: Needs assistance Equipment used: Rolling walker (2 wheeled) Transfers: Sit to/from Stand Sit to Stand: Min guard;Supervision         General transfer comment: cues for LE management and use of UEs to self assist  Ambulation/Gait Ambulation/Gait assistance: Min guard;Supervision Ambulation Distance (Feet): 200 Feet Assistive device: Rolling walker (2 wheeled) Gait Pattern/deviations: Decreased step length - right;Decreased step length - left;Step-to pattern;Step-through pattern;Shuffle;Trunk flexed;Antalgic Gait velocity: decr Gait velocity interpretation: Below normal speed for age/gender General Gait Details: cues for posture, position from RW and initial sequence  Stairs            Wheelchair Mobility    Modified Rankin (Stroke Patients Only)       Balance Overall balance assessment: No apparent balance deficits (not formally assessed)                                           Pertinent Vitals/Pain Pain Assessment: 0-10 Pain Score: 2  Pain Location: L hip Pain Descriptors / Indicators: Sore;Tightness Pain Intervention(s):  Limited activity within patient's tolerance;Monitored during session;Premedicated before session;Ice applied    Home Living Family/patient expects to be discharged to:: Private residence Living Arrangements: Spouse/significant other Available Help at Discharge: Family Type of Home: House Home Access: Level entry     Home Layout: Two level Home Equipment: Environmental consultant - 2 wheels;Cane - single point;Crutches      Prior Function Level of Independence: Independent               Hand Dominance        Extremity/Trunk Assessment   Upper Extremity Assessment Upper Extremity Assessment: Overall WFL for tasks assessed    Lower Extremity Assessment Lower Extremity Assessment: RLE deficits/detail RLE Deficits / Details: Strength at hip 2/5 with AAROM at hip to 80 flex and 15 abd    Cervical / Trunk Assessment Cervical / Trunk Assessment: Normal  Communication   Communication: No difficulties  Cognition Arousal/Alertness: Awake/alert Behavior During Therapy: WFL for tasks assessed/performed Overall Cognitive Status: Within Functional Limits for tasks assessed                                        General Comments      Exercises Total Joint Exercises Ankle Circles/Pumps: AROM;Both;15 reps;Supine Quad Sets: AROM;Both;10 reps;Supine Heel Slides: AAROM;Left;15 reps;Supine Hip ABduction/ADduction: AAROM;Left;Supine;10 reps Long Arc Quad: AAROM;AROM;Left;10 reps;Seated   Assessment/Plan  PT Assessment Patient needs continued PT services  PT Problem List Decreased strength;Decreased range of motion;Decreased activity tolerance;Decreased mobility;Decreased knowledge of use of DME;Pain       PT Treatment Interventions DME instruction;Gait training;Stair training;Functional mobility training;Therapeutic activities;Therapeutic exercise;Patient/family education    PT Goals (Current goals can be found in the Care Plan section)  Acute Rehab PT Goals Patient  Stated Goal: Regain IND and back to work PT Goal Formulation: With patient Time For Goal Achievement: 01/06/17 Potential to Achieve Goals: Good    Frequency 7X/week   Barriers to discharge        Co-evaluation               AM-PAC PT "6 Clicks" Daily Activity  Outcome Measure Difficulty turning over in bed (including adjusting bedclothes, sheets and blankets)?: A Lot Difficulty moving from lying on back to sitting on the side of the bed? : A Lot Difficulty sitting down on and standing up from a chair with arms (e.g., wheelchair, bedside commode, etc,.)?: A Lot Help needed moving to and from a bed to chair (including a wheelchair)?: A Little Help needed walking in hospital room?: A Little Help needed climbing 3-5 steps with a railing? : A Little 6 Click Score: 15    End of Session Equipment Utilized During Treatment: Gait belt Activity Tolerance: Patient tolerated treatment well Patient left: in chair;with call bell/phone within reach;with family/visitor present Nurse Communication: Mobility status PT Visit Diagnosis: Difficulty in walking, not elsewhere classified (R26.2)    Time: 1050-1120 PT Time Calculation (min) (ACUTE ONLY): 30 min   Charges:   PT Evaluation $PT Eval Low Complexity: 1 Low PT Treatments $Therapeutic Exercise: 8-22 mins   PT G Codes:        Pg [phone]   Nacole Fluhr 01/04/2017, 12:44 PM

## 2017-01-04 NOTE — Progress Notes (Signed)
Discharge planning, no HH needs identified. [phone] 

## 2018-05-21 IMAGING — DX DG PORTABLE PELVIS
1 series · 1 of 1 positions shown · non-contrast
Comparison: Fluoroscopy from earlier today.

CLINICAL DATA: Status post right hip replacement

EXAM:
PORTABLE PELVIS 1-2 VIEWS

[pelvis ap]
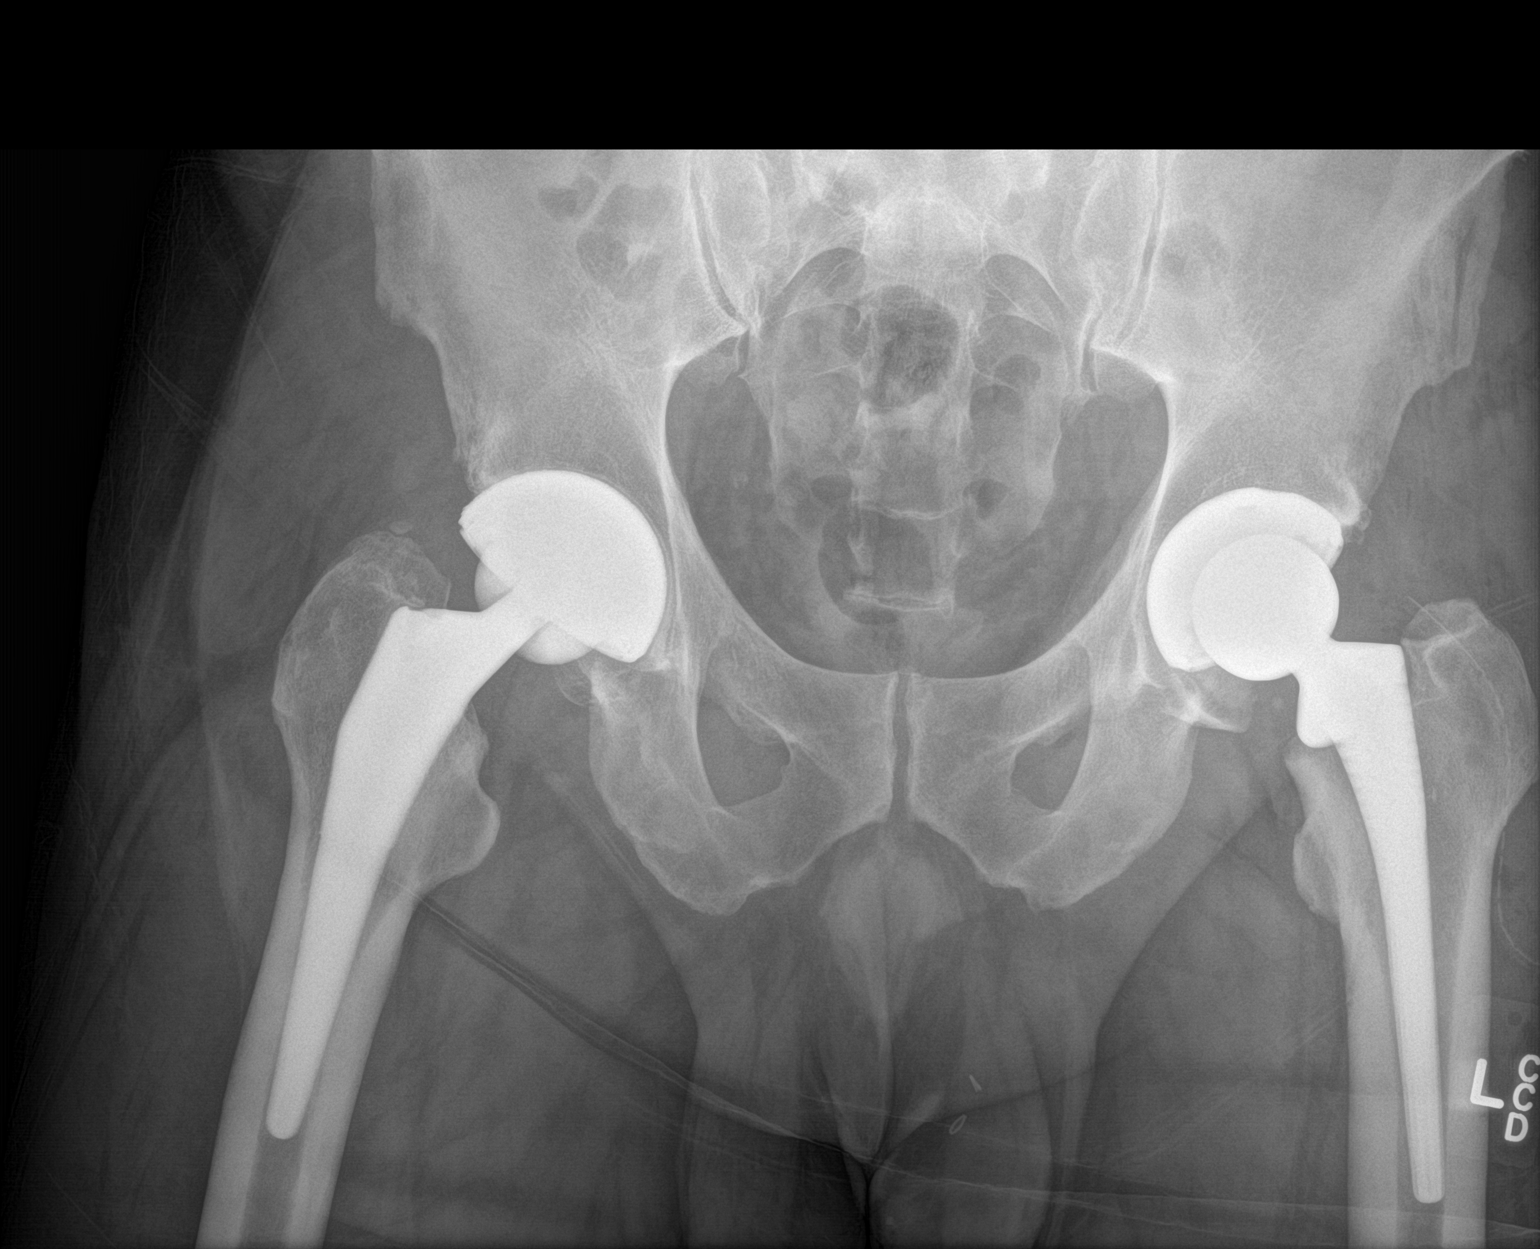

[1 of 1 positions shown; findings below may reference images not displayed]

FINDINGS: Bilateral total hip arthroplasty. The left-sided arthroplasty is
recent, with drain in place. No periprosthetic fracture or
dislocation in the AP projection. The right hip arthroplasty shows
an asymmetric cup alignment with lucency medially.
IMPRESSION: 1. No unexpected finding along the recent left hip arthroplasty.
2. Asymmetric right hip arthroplasty cup alignment with medial
lucency.
